# Patient Record
Sex: Female | Born: 1965
Health system: Southern US, Community
[De-identification: ages and names within clinical notes are randomized; demographics above are authoritative.]

## PROBLEM LIST (undated history)

## (undated) DIAGNOSIS — Z789 Other specified health status: Secondary | ICD-10-CM

## (undated) HISTORY — PX: APPENDECTOMY: SHX54

## (undated) HISTORY — PX: GALLBLADDER SURGERY: SHX652

## (undated) HISTORY — PX: ABDOMINAL HYSTERECTOMY: SHX81

---

## 2000-04-07 ENCOUNTER — Inpatient Hospital Stay (HOSPITAL_COMMUNITY): Admission: AD | Admit: 2000-04-07 | Discharge: 2000-04-07 | Payer: Self-pay | Admitting: Obstetrics and Gynecology

## 2000-05-10 ENCOUNTER — Inpatient Hospital Stay (HOSPITAL_COMMUNITY): Admission: AD | Admit: 2000-05-10 | Discharge: 2000-05-13 | Payer: Self-pay | Admitting: Obstetrics and Gynecology

## 2000-05-14 ENCOUNTER — Encounter: Admission: RE | Admit: 2000-05-14 | Discharge: 2000-06-15 | Payer: Self-pay | Admitting: Obstetrics and Gynecology

## 2000-06-14 ENCOUNTER — Other Ambulatory Visit: Admission: RE | Admit: 2000-06-14 | Discharge: 2000-06-14 | Payer: Self-pay | Admitting: Obstetrics and Gynecology

## 2001-07-24 ENCOUNTER — Other Ambulatory Visit: Admission: RE | Admit: 2001-07-24 | Discharge: 2001-07-24 | Payer: Self-pay | Admitting: Obstetrics and Gynecology

## 2002-11-01 ENCOUNTER — Other Ambulatory Visit: Admission: RE | Admit: 2002-11-01 | Discharge: 2002-11-01 | Payer: Self-pay | Admitting: Obstetrics and Gynecology

## 2004-05-15 ENCOUNTER — Other Ambulatory Visit: Admission: RE | Admit: 2004-05-15 | Discharge: 2004-05-15 | Payer: Self-pay | Admitting: Obstetrics and Gynecology

## 2006-03-04 ENCOUNTER — Ambulatory Visit (HOSPITAL_COMMUNITY): Admission: RE | Admit: 2006-03-04 | Discharge: 2006-03-05 | Payer: Self-pay | Admitting: Obstetrics and Gynecology

## 2011-03-29 ENCOUNTER — Emergency Department (HOSPITAL_COMMUNITY): Payer: PRIVATE HEALTH INSURANCE

## 2011-03-29 ENCOUNTER — Encounter: Payer: Self-pay | Admitting: Neurology

## 2011-03-29 ENCOUNTER — Other Ambulatory Visit: Payer: Self-pay

## 2011-03-29 ENCOUNTER — Emergency Department (HOSPITAL_COMMUNITY)
Admission: EM | Admit: 2011-03-29 | Discharge: 2011-03-29 | Disposition: A | Discharge status: Home / Self Care | Payer: PRIVATE HEALTH INSURANCE | Attending: Emergency Medicine | Admitting: Emergency Medicine

## 2011-03-29 DIAGNOSIS — R071 Chest pain on breathing: Secondary | ICD-10-CM | POA: Insufficient documentation

## 2011-03-29 LAB — POCT I-STAT, CHEM 8
Calcium, Ion: 1.19 mmol/L (ref 1.12–1.32)
Chloride: 107 mEq/L (ref 96–112)
HCT: 39 % (ref 36.0–46.0)
Sodium: 140 mEq/L (ref 135–145)

## 2011-03-29 LAB — POCT I-STAT TROPONIN I: Troponin i, poc: 0.01 ng/mL (ref 0.00–0.08)

## 2011-03-29 LAB — D-DIMER, QUANTITATIVE: D-Dimer, Quant: 0.27 ug/mL-FEU (ref 0.00–0.48)

## 2011-03-29 MED ORDER — MORPHINE SULFATE 4 MG/ML IJ SOLN
4.0000 mg | Freq: Once | INTRAMUSCULAR | Status: AC
  Administered 2011-03-29: 4 mg via INTRAVENOUS
  Filled 2011-03-29: qty 1

## 2011-03-29 MED ORDER — HYDROCODONE-ACETAMINOPHEN 5-325 MG PO TABS
1.0000 | ORAL_TABLET | Freq: Four times a day (QID) | ORAL | Status: AC | PRN
Start: 2011-03-29 — End: 2011-04-08

## 2011-03-29 MED ORDER — IBUPROFEN 800 MG PO TABS: 800.0000 mg | ORAL_TABLET | Freq: Three times a day (TID) | ORAL | Status: AC

## 2011-03-29 MED ORDER — KETOROLAC TROMETHAMINE 30 MG/ML IJ SOLN
30.0000 mg | Freq: Once | INTRAMUSCULAR | Status: AC
  Administered 2011-03-29: 30 mg via INTRAVENOUS
  Filled 2011-03-29: qty 1

## 2011-03-29 NOTE — ED Notes (Addendum)
PER EMS- Pt comes from PCP. Cp started last night at 0100, took tylenol PM and 1 baby aspirin. No relief. Went to PCP this morning, c/o chest discomfort "elephant sitting on chest". Pt given 1 SL nitro, GI cocktail, no change in pain. No radiation. No SOB. Reporting nausea no vomiting. 20 in L. AC. CBG 134. 146/86, 77, 18 100% 2 L. Pt report yesterday had [place] for lunch.

## 2011-03-29 NOTE — ED Provider Notes (Signed)
History     CSN: [id number]  Arrival date & time 03/29/11  9563   First MD Initiated Contact with Patient 03/29/11 [phone]      Chief Complaint  Patient presents with  . Chest Pain    (Consider location/radiation/quality/duration/timing/severity/associated sxs/prior treatment) HPI Complains of anterior chest pain substernal in location nonradiating onset 1 AM today awaken her from sleep. Pain has a pleuritic component is nonradiating made worse by lying supine not improved by anything no associated shortness of breath, nausea or sweatiness. No other associated symptoms. At her to severe at present describe as 8 on a scale of 1-10. Treated with one baby aspirin, GI cocktail, and one sublingual nitroglycerin without relief History reviewed. No pertinent past medical history.  Past Surgical History  Procedure Date  . Appendectomy   . Abdominal hysterectomy    Varicose veins stripping No family history on file.  History  Substance Use Topics  . Smoking status: Never Smoker   . Smokeless tobacco: Not on file  . Alcohol Use: No    OB History    Grav Para Term Preterm Abortions TAB SAB Ect Mult Living                  Review of Systems  Constitutional: Negative.   HENT: Negative.   Respiratory: Negative.   Cardiovascular: Positive for chest pain.  Gastrointestinal: Negative.   Musculoskeletal: Negative.   Skin: Negative.   Neurological: Negative.   Hematological: Negative.   Psychiatric/Behavioral: Negative.     Allergies  Review of patient's allergies indicates no known allergies.  Home Medications  No current outpatient prescriptions on file.  BP 151/83  Pulse 70  Temp(Src) 98 F (36.7 C) (Oral)  Resp 18  Ht 5\' 11"  (1.803 m)  Wt 172 lb (78.019 kg)  BMI 23.99 kg/m2  SpO2 100%  Physical Exam  Nursing note and vitals reviewed. Constitutional: She appears well-developed and well-nourished.  HENT:  Head: Normocephalic and atraumatic.  Eyes: Conjunctivae are  normal. Pupils are equal, round, and reactive to light.  Neck: Neck supple. No tracheal deviation present. No thyromegaly present.  Cardiovascular: Normal rate, regular rhythm and intact distal pulses.   No murmur heard. Pulmonary/Chest: Effort normal and breath sounds normal.       Tender over sternum reproducing pain exactly  Abdominal: Soft. Bowel sounds are normal. She exhibits no distension. There is no tenderness.  Musculoskeletal: Normal range of motion. She exhibits no edema and no tenderness.  Neurological: She is alert. Coordination normal.  Skin: Skin is warm and dry. No rash noted.  Psychiatric: She has a normal mood and affect.    ED Course  Procedures (including critical care time)  Labs Reviewed - No data to display No results found.   No diagnosis found.  Date: 03/29/2011  Rate: 70  Rhythm: normal sinus rhythm  QRS Axis: normal  Intervals: normal  ST/T Wave abnormalities: normal  Conduction Disutrbances:none  Narrative Interpretation:   Old EKG Reviewed: none available    MDM  Pretest probability for ACS low area patient has no cardiac risk factors normal EKG atypical history. Move to CDU. Outpatient cardiac workup if today's emergency department workup is unremarkable Diagnoses atypical chest pain        Doug Sou, MD 03/29/11 1127

## 2011-03-29 NOTE — ED Provider Notes (Signed)
Medical screening examination/treatment/procedure(s) were conducted as a shared visit with non-physician practitioner(s) and myself.  I personally evaluated the patient during the encounter  Doug Sou, MD 03/29/11 1750

## 2011-03-29 NOTE — ED Provider Notes (Signed)
12:30 PM patient is in CDU holding for cardiac marker and call to cardiology to establish outpatient followup, d/c home if all normal. This is a shared visit with Dr Ethelda Chick. Patient reports continued anterior chest pressure that has been unrelieved with morphine. On exam, pt is A&Ox4, NAD, RRR, no m/r/g, CTAB.  I have ordered Toradol for her pain.  Will continue to follow.  Patient has last cardiac marker scheduled at 1:30pm.   12:56 PM Per patient's paperwork from [facility], [facility] cardiology was to meet patient in ED.  I have spoken with Rosann Auerbach of [place] who states Dr Antoine Poche wanted to follow the normal procedure of having the ED physician evaluate patient.  I have discussed this with Dr Ethelda Chick who states he would like to continue to original plan, cardiac marker at 1:30pm, outpatient cardiology follow up.  Patient reports pain is much improved with toradol.    2:20 PM Patient requests follow up with [facility] as that is where her husband is seen.  I have discussed patient with Wilburt Finlay of [facility] who will arrange follow up for patient.    Dillard Cannon Doffing, [place] 03/29/11 1609

## 2011-06-08 ENCOUNTER — Other Ambulatory Visit: Payer: Self-pay | Admitting: Family Medicine

## 2011-06-08 ENCOUNTER — Encounter (HOSPITAL_COMMUNITY): Payer: Self-pay | Admitting: *Deleted

## 2011-06-08 ENCOUNTER — Observation Stay (HOSPITAL_COMMUNITY)
Admission: EM | Admit: 2011-06-08 | Discharge: 2011-06-12 | DRG: 494 | Disposition: A | Discharge status: Home / Self Care | Payer: BC Managed Care – PPO | Attending: General Surgery | Admitting: General Surgery

## 2011-06-08 ENCOUNTER — Ambulatory Visit (HOSPITAL_COMMUNITY)
Admission: RE | Admit: 2011-06-08 | Discharge: 2011-06-08 | Disposition: A | Discharge status: Home / Self Care | Payer: BC Managed Care – PPO | Source: Ambulatory Visit | Attending: Family Medicine | Admitting: Family Medicine

## 2011-06-08 ENCOUNTER — Emergency Department (HOSPITAL_COMMUNITY): Payer: BC Managed Care – PPO

## 2011-06-08 ENCOUNTER — Other Ambulatory Visit: Payer: Self-pay

## 2011-06-08 DIAGNOSIS — R112 Nausea with vomiting, unspecified: Secondary | ICD-10-CM | POA: Diagnosis present

## 2011-06-08 DIAGNOSIS — K819 Cholecystitis, unspecified: Secondary | ICD-10-CM

## 2011-06-08 DIAGNOSIS — K8062 Calculus of gallbladder and bile duct with acute cholecystitis without obstruction: Principal | ICD-10-CM | POA: Diagnosis present

## 2011-06-08 DIAGNOSIS — Z01812 Encounter for preprocedural laboratory examination: Secondary | ICD-10-CM

## 2011-06-08 HISTORY — DX: Other specified health status: Z78.9

## 2011-06-08 LAB — COMPREHENSIVE METABOLIC PANEL
AST: 689 U/L — ABNORMAL HIGH (ref 0–37)
Albumin: 4.4 g/dL (ref 3.5–5.2)
Alkaline Phosphatase: 308 U/L — ABNORMAL HIGH (ref 39–117)
Chloride: 97 mEq/L (ref 96–112)
Potassium: 3.4 mEq/L — ABNORMAL LOW (ref 3.5–5.1)
Sodium: 136 mEq/L (ref 135–145)
Total Bilirubin: 5.6 mg/dL — ABNORMAL HIGH (ref 0.3–1.2)

## 2011-06-08 LAB — URINALYSIS, ROUTINE W REFLEX MICROSCOPIC
Glucose, UA: NEGATIVE mg/dL
Ketones, ur: >80 mg/dL — AB
Leukocytes, UA: NEGATIVE
pH: 8 (ref 5.0–8.0)

## 2011-06-08 LAB — CBC
MCHC: 34.2 g/dL (ref 30.0–36.0)
RDW: 12.4 % (ref 11.5–15.5)

## 2011-06-08 LAB — DIFFERENTIAL
Basophils Absolute: 0 10*3/uL (ref 0.0–0.1)
Basophils Relative: 0 % (ref 0–1)
Neutro Abs: 12.4 10*3/uL — ABNORMAL HIGH (ref 1.7–7.7)
Neutrophils Relative %: 89 % — ABNORMAL HIGH (ref 43–77)

## 2011-06-08 LAB — URINE MICROSCOPIC-ADD ON

## 2011-06-08 MED ORDER — ONDANSETRON HCL 4 MG/2ML IJ SOLN
4.0000 mg | Freq: Once | INTRAMUSCULAR | Status: AC
  Administered 2011-06-08: 4 mg via INTRAVENOUS

## 2011-06-08 MED ORDER — PROMETHAZINE HCL 25 MG/ML IJ SOLN
INTRAMUSCULAR | Status: AC
  Administered 2011-06-08: 21:00:00
  Filled 2011-06-08: qty 1

## 2011-06-08 MED ORDER — ONDANSETRON HCL 4 MG/2ML IJ SOLN
4.0000 mg | Freq: Once | INTRAMUSCULAR | Status: AC
  Administered 2011-06-08: 4 mg via INTRAVENOUS
  Filled 2011-06-08: qty 2

## 2011-06-08 MED ORDER — LACTATED RINGERS IV SOLN
INTRAVENOUS | Status: DC
  Administered 2011-06-09 (×2): via INTRAVENOUS

## 2011-06-08 MED ORDER — ONDANSETRON HCL 4 MG/2ML IJ SOLN
4.0000 mg | Freq: Once | INTRAMUSCULAR | Status: AC
  Administered 2011-06-11: 4 mg via INTRAVENOUS

## 2011-06-08 MED ORDER — HYDROMORPHONE HCL PF 1 MG/ML IJ SOLN
1.0000 mg | Freq: Once | INTRAMUSCULAR | Status: AC
Start: 2011-06-08 — End: 2011-06-08
  Administered 2011-06-08: 1 mg via INTRAVENOUS
  Filled 2011-06-08: qty 1

## 2011-06-08 MED ORDER — PANTOPRAZOLE SODIUM 40 MG IV SOLR
40.0000 mg | Freq: Every day | INTRAVENOUS | Status: DC
  Administered 2011-06-08 – 2011-06-11 (×4): 40 mg via INTRAVENOUS
  Filled 2011-06-08 (×4): qty 40

## 2011-06-08 MED ORDER — ENOXAPARIN SODIUM 40 MG/0.4ML ~~LOC~~ SOLN
40.0000 mg | SUBCUTANEOUS | Status: DC
  Filled 2011-06-08: qty 0.4

## 2011-06-08 MED ORDER — HYDROMORPHONE HCL PF 1 MG/ML IJ SOLN
1.0000 mg | INTRAMUSCULAR | Status: DC | PRN
  Administered 2011-06-08: 2 mg via INTRAVENOUS
  Administered 2011-06-09: 1 mg via INTRAVENOUS
  Administered 2011-06-09 (×2): 2 mg via INTRAVENOUS
  Administered 2011-06-10: 1 mg via INTRAVENOUS
  Filled 2011-06-08 (×2): qty 2
  Filled 2011-06-08: qty 1
  Filled 2011-06-08 (×3): qty 2

## 2011-06-08 MED ORDER — ONDANSETRON HCL 4 MG/2ML IJ SOLN
4.0000 mg | Freq: Four times a day (QID) | INTRAMUSCULAR | Status: DC | PRN
Start: 2011-06-08 — End: 2011-06-12
  Filled 2011-06-08: qty 2

## 2011-06-08 MED ORDER — HYDROMORPHONE HCL PF 1 MG/ML IJ SOLN
INTRAMUSCULAR | Status: AC
  Filled 2011-06-08: qty 1

## 2011-06-08 MED ORDER — SODIUM CHLORIDE 0.9 % IJ SOLN
INTRAMUSCULAR | Status: AC
  Administered 2011-06-08
  Filled 2011-06-08: qty 3

## 2011-06-08 MED ORDER — HYDROMORPHONE HCL PF 1 MG/ML IJ SOLN
1.0000 mg | Freq: Once | INTRAMUSCULAR | Status: AC
  Administered 2011-06-08: 1 mg via INTRAVENOUS

## 2011-06-08 MED ORDER — SODIUM CHLORIDE 0.9 % IV BOLUS (SEPSIS)
1000.0000 mL | Freq: Once | INTRAVENOUS | Status: AC
  Administered 2011-06-08: 1000 mL via INTRAVENOUS

## 2011-06-08 MED ORDER — ONDANSETRON HCL 4 MG/2ML IJ SOLN
INTRAMUSCULAR | Status: AC
  Filled 2011-06-08: qty 2

## 2011-06-08 MED ORDER — GADOBENATE DIMEGLUMINE 529 MG/ML IV SOLN
15.0000 mL | Freq: Once | INTRAVENOUS | Status: AC | PRN
  Administered 2011-06-08: 15 mL via INTRAVENOUS

## 2011-06-08 MED ORDER — SODIUM CHLORIDE 0.9 % IV SOLN
1.0000 g | Freq: Once | INTRAVENOUS | Status: AC
  Administered 2011-06-08: 19:00:00 via INTRAVENOUS
  Filled 2011-06-08: qty 1

## 2011-06-08 MED ORDER — SODIUM CHLORIDE 0.9 % IV SOLN
1.0000 g | INTRAVENOUS | Status: DC
  Administered 2011-06-10: 1 g via INTRAVENOUS
  Filled 2011-06-08 (×5): qty 1

## 2011-06-08 NOTE — ED Notes (Signed)
Patient remains in mri  

## 2011-06-08 NOTE — H&P (Signed)
Traci Molina is an 46 y.o. female.   Chief Complaint: Chest pain/epigastric abdominal pain HPI: Patient presents to [facility] with over a month history of epigastric and retrosternal chest pain. She states she is actually presented to the emergency department on several previous occasions and was asked to transferred previously to come hospital for cardiac evaluation. All cardiac workup has been negative to date and patient continues to have epigastric and sternal chest pain. She states pain has been progressively getting worse with radiation to the right upper quadrant and right back. Pain is worse with eating. She has not noticed any particular foods causing increased symptomatology. Her husband has stated he noted her appearing more jaundiced today. She has noticed increase in dark urine over the last 24 hours. She has not noticed any acholic stools however she denies any bowel movement over the last 24-48 hours. Her last bowel movement was reported as normal with no melena or hematochezia. She has had some low-grade fevers and subjective chills. Her appetite is poor. She does have a history of biliary disease. She has had a previous pregnancy. No unusual travel or exposures. No sick contacts. No similar symptomatology prior to this last month.  History reviewed. No pertinent past medical history.  Past Surgical History  Procedure Date  . Appendectomy   . Abdominal hysterectomy     History reviewed. No pertinent family history. Social History:  reports that she has never smoked. She does not have any smokeless tobacco history on file. She reports that she does not drink alcohol or use illicit drugs.  Allergies: No Known Allergies  Medications Prior to Admission  Medication Dose Route Frequency Provider Last Rate Last Dose  . enoxaparin (LOVENOX) injection 40 mg  40 mg Subcutaneous Q24H Fabio Bering, MD      . ertapenem [facility]) 1 g in sodium chloride 0.9 % 50 mL IVPB  1 g  Intravenous Once Glynn Octave, MD      . ertapenem [facility]) 1 g in sodium chloride 0.9 % 50 mL IVPB  1 g Intravenous Q24H Fabio Bering, MD      . HYDROmorphone (DILAUDID) injection 1 mg  1 mg Intravenous Once Glynn Octave, MD   1 mg at 06/08/11 1757  . HYDROmorphone (DILAUDID) injection 1 mg  1 mg Intravenous Once Glynn Octave, MD   1 mg at 06/08/11 2027  . HYDROmorphone (DILAUDID) injection 1-2 mg  1-2 mg Intravenous Q4H PRN Fabio Bering, MD      . lactated ringers infusion   Intravenous Continuous Fabio Bering, MD      . ondansetron [facility]) injection 4 mg  4 mg Intravenous Once Glynn Octave, MD   4 mg at 06/08/11 1756  . ondansetron (ZOFRAN) injection 4 mg  4 mg Intravenous Once Glynn Octave, MD   4 mg at 06/08/11 2010  . ondansetron (ZOFRAN) injection 4 mg  4 mg Intravenous Once Glynn Octave, MD      . ondansetron [facility]) injection 4 mg  4 mg Intravenous Q6H PRN Fabio Bering, MD      . pantoprazole (PROTONIX) injection 40 mg  40 mg Intravenous QHS Fabio Bering, MD      . sodium chloride 0.9 % bolus 1,000 mL  1,000 mL Intravenous Once Glynn Octave, MD   1,000 mL at 06/08/11 1756   No current outpatient prescriptions on file as of 06/08/2011.    Results for orders placed during the hospital encounter of 06/08/11 (  from the past 48 hour(s))  CBC     Status: Abnormal   Collection Time   06/08/11  5:50 PM      Component Value Range Comment   WBC 14.0 (*) 4.0 - 10.5 (K/uL)    RBC 4.93  3.87 - 5.11 (MIL/uL)    Hemoglobin 13.9  12.0 - 15.0 (g/dL)    HCT 16.1  09.6 - 04.5 (%)    MCV 82.6  78.0 - 100.0 (fL)    MCH 28.2  26.0 - 34.0 (pg)    MCHC 34.2  30.0 - 36.0 (g/dL)    RDW 40.9  81.1 - 91.4 (%)    Platelets 230  150 - 400 (K/uL)   DIFFERENTIAL     Status: Abnormal   Collection Time   06/08/11  5:50 PM      Component Value Range Comment   Neutrophils Relative 89 (*) 43 - 77 (%)    Neutro Abs 12.4 (*) 1.7 - 7.7 (K/uL)    Lymphocytes Relative 4 (*) 12 -  46 (%)    Lymphs Abs 0.5 (*) 0.7 - 4.0 (K/uL)    Monocytes Relative 7  3 - 12 (%)    Monocytes Absolute 1.0  0.1 - 1.0 (K/uL)    Eosinophils Relative 0  0 - 5 (%)    Eosinophils Absolute 0.0  0.0 - 0.7 (K/uL)    Basophils Relative 0  0 - 1 (%)    Basophils Absolute 0.0  0.0 - 0.1 (K/uL)   COMPREHENSIVE METABOLIC PANEL     Status: Abnormal   Collection Time   06/08/11  5:50 PM      Component Value Range Comment   Sodium 136  135 - 145 (mEq/L)    Potassium 3.4 (*) 3.5 - 5.1 (mEq/L)    Chloride 97  96 - 112 (mEq/L)    CO2 25  19 - 32 (mEq/L)    Glucose, Bld 122 (*) 70 - 99 (mg/dL)    BUN 9  6 - 23 (mg/dL)    Creatinine, Ser 7.82  0.50 - 1.10 (mg/dL)    Calcium 95.6  8.4 - 10.5 (mg/dL)    Total Protein 8.1  6.0 - 8.3 (g/dL)    Albumin 4.4  3.5 - 5.2 (g/dL)    AST 213 (*) 0 - 37 (U/L)    ALT 1278 (*) 0 - 35 (U/L)    Alkaline Phosphatase 308 (*) 39 - 117 (U/L)    Total Bilirubin 5.6 (*) 0.3 - 1.2 (mg/dL)    GFR calc non Af Amer >90  >90 (mL/min)    GFR calc Af Amer >90  >90 (mL/min)   LIPASE, BLOOD     Status: Normal   Collection Time   06/08/11  5:50 PM      Component Value Range Comment   Lipase 24  11 - 59 (U/L)   TROPONIN I     Status: Normal   Collection Time   06/08/11  5:50 PM      Component Value Range Comment   Troponin I <0.30  <0.30 (ng/mL)   BILIRUBIN, DIRECT     Status: Abnormal   Collection Time   06/08/11  5:50 PM      Component Value Range Comment   Bilirubin, Direct 3.4 (*) 0.0 - 0.3 (mg/dL)   URINALYSIS, ROUTINE W REFLEX MICROSCOPIC     Status: Abnormal   Collection Time   06/08/11  7:18 PM      Component Value  Range Comment   Color, Urine ORANGE (*) YELLOW  BIOCHEMICALS MAY BE AFFECTED BY COLOR   APPearance CLEAR  CLEAR     Specific Gravity, Urine 1.020  1.005 - 1.030     pH 8.0  5.0 - 8.0     Glucose, UA NEGATIVE  NEGATIVE (mg/dL)    Hgb urine dipstick TRACE (*) NEGATIVE     Bilirubin Urine LARGE (*) NEGATIVE     Ketones, ur >80 (*) NEGATIVE (mg/dL)     Protein, ur TRACE (*) NEGATIVE (mg/dL)    Urobilinogen, UA 1.0  0.0 - 1.0 (mg/dL)    Nitrite NEGATIVE  NEGATIVE     Leukocytes, UA NEGATIVE  NEGATIVE    URINE MICROSCOPIC-ADD ON     Status: Abnormal   Collection Time   06/08/11  7:18 PM      Component Value Range Comment   Squamous Epithelial / LPF FEW (*) RARE     WBC, UA 3-6  <3 (WBC/hpf)    RBC / HPF 3-6  <3 (RBC/hpf)    Bacteria, UA FEW (*) RARE     Dg Chest 2 View  06/08/2011  *RADIOLOGY REPORT*  Clinical Data: Abdominal pain  CHEST - 2 VIEW  Comparison: 03/29/2011  Findings:  The heart size is normal.  There is no pleural effusion or edema.  No airspace consolidation identified.  Review of the visualized osseous structures is unremarkable.  IMPRESSION:  1.  No acute findings.  Original Report Authenticated By: Rosealee Albee, M.D.   US Abdomen Limited Ruq  06/08/2011  *RADIOLOGY REPORT*  Clinical Data:  Nausea, vomiting, abdominal pain  LIMITED ABDOMINAL ULTRASOUND - RIGHT UPPER QUADRANT  Comparison:  None  Findings:  Gallbladder:  Shadowing gallstones and sludge within gallbladder. Minimally thickened gallbladder wall.  No definite pericholecystic fluid or sonographic Murphy's sign.  Common bile duct:  Dilated, 9 mm diameter.  Liver:  Normal echogenicity without definite mass.  Intrahepatic biliary dilatation present.  Hepatopetal portal venous flow.  No right upper quadrant ascites.  IMPRESSION: Distended gallbladder containing sludge and shadowing gallstones, associated with mild gallbladder wall thickening, question early acute cholecystitis. Intrahepatic and extrahepatic biliary dilatation, CBD 9 mm diameter. Distal CBD obstruction/choledocolithiasis not excluded.                   Original Report Authenticated By: Lollie Marrow, M.D.    Review of Systems  Constitutional: Positive for fever, chills, weight loss, malaise/fatigue and diaphoresis.  HENT: Negative.   Eyes: Negative.   Respiratory: Negative.   Cardiovascular: Positive  for chest pain.  Gastrointestinal: Positive for heartburn, nausea, vomiting and abdominal pain (epigastric and right upper quadrant). Negative for diarrhea, blood in stool and melena.  Genitourinary:       Urine has been dark  Musculoskeletal: Negative.   Skin: Positive for itching.       Yellow  Neurological: Negative.  Negative for weakness.  Endo/Heme/Allergies: Negative.   Psychiatric/Behavioral: Negative.     Blood pressure 167/89, pulse 86, temperature 99.6 F (37.6 C), temperature source Oral, resp. rate 16, height 5\' 11"  (1.803 m), weight 78.019 kg (172 lb), SpO2 95.00%. Physical Exam  Constitutional: She is oriented to person, place, and time. She appears well-developed and well-nourished. No distress.       Uncomfortable in appearance  HENT:  Head: Normocephalic and atraumatic.  Eyes: Conjunctivae and EOM are normal. Pupils are equal, round, and reactive to light. Scleral icterus is present.  Neck: Normal range of motion.  No tracheal deviation present. No thyromegaly present.  Cardiovascular: Normal rate, regular rhythm and normal heart sounds.   Respiratory: Effort normal and breath sounds normal. No respiratory distress. She has no wheezes.  GI: Soft. She exhibits no distension and no mass. There is tenderness (epigastric and right upper quadrant. Positive Murphy sign. No diffuse peritoneal signs.). There is no rebound and no guarding.  Musculoskeletal: Normal range of motion.  Lymphadenopathy:    She has no cervical adenopathy.  Neurological: She is alert and oriented to person, place, and time.  Skin: Skin is warm and dry.       Diffuse jaundice     Assessment/Plan Acute cholecystitis was suspected common bile duct stone and obstruction. Findings were discussed with the patient and her husband. At this point she will be kept in an n.p.o. status other than a couple eyes chips. Will be continued on IV fluid hydration. Blood work will be reevaluated tomorrow morning to  reevaluate bilirubin levels as well as obtaining an MRCP for assistance of gastroenterology. I highly suspect patient will require an ERCP but again will check the results of the MRCP in the morning. Patient will be continued on IV fluid hydration, IV antibiotics with Invanz, bowel rest, and pain and nausea control. Surgical indications were discussed with both the patient and her husband. Again at this point evaluation for common bile duct stone we'll need to proceed any planned cholecystectomy. Traci Molina C 06/08/2011, 8:31 PM

## 2011-06-08 NOTE — ED Notes (Signed)
Pt c/o epigastric pain off and on since January, worse over the past two days, also associated with n/v, worse after eating, pt sent to er from ultrasound. Dr. Manus Gunning in room with pt upon arrival to ed.

## 2011-06-08 NOTE — ED Notes (Signed)
abd pain, NV, sent from U/S, says she is having gall bladder problem.

## 2011-06-08 NOTE — ED Provider Notes (Signed)
History     CSN: [id number]  Arrival date & time 06/08/11  1659   First MD Initiated Contact with Patient 06/08/11 1725      Chief Complaint  Patient presents with  . Abdominal Pain    (Consider location/radiation/quality/duration/timing/severity/associated sxs/prior treatment) HPI Comments: Patient sent from ultrasound with findings worrisome for cholecystitis. She's been having right upper quadrant pain with nausea and vomiting intermittently for 2 months and has gotten worse in the past 3 days. She states her pain has been constant since Friday and she had some nausea and vomiting 2 days ago. She's had poor appetite. She denies any fever, chills, chest pain, shortness of breath. January she was seen and evaluated for the same pain and had a negative cardiac workup.  The history is provided by the patient.    History reviewed. No pertinent past medical history.  Past Surgical History  Procedure Date  . Appendectomy   . Abdominal hysterectomy     History reviewed. No pertinent family history.  History  Substance Use Topics  . Smoking status: Never Smoker   . Smokeless tobacco: Not on file  . Alcohol Use: No    OB History    Grav Para Term Preterm Abortions TAB SAB Ect Mult Living                  Review of Systems  Constitutional: Positive for activity change and appetite change. Negative for fever.  HENT: Negative for congestion and rhinorrhea.   Respiratory: Negative for cough, chest tightness and shortness of breath.   Cardiovascular: Negative for chest pain.  Gastrointestinal: Positive for nausea, vomiting and abdominal pain. Negative for diarrhea.  Genitourinary: Negative for dysuria, hematuria, vaginal bleeding and vaginal discharge.  Musculoskeletal: Negative for back pain.  Skin: Negative for rash.  Neurological: Negative for headaches.    Allergies  Review of patient's allergies indicates no known allergies.  Home Medications   No current  outpatient prescriptions on file.  BP 164/84  Pulse 80  Temp(Src) 98.3 F (36.8 C) (Oral)  Resp 18  Ht 5\' 11"  (1.803 m)  Wt 188 lb 8 oz (85.503 kg)  BMI 26.29 kg/m2  SpO2 97%  Physical Exam  Constitutional: She is oriented to person, place, and time. She appears well-developed and well-nourished. No distress.  HENT:  Head: Normocephalic and atraumatic.  Mouth/Throat: Oropharynx is clear and moist. No oropharyngeal exudate.  Eyes: Conjunctivae are normal. Pupils are equal, round, and reactive to light. Scleral icterus is present.  Neck: Normal range of motion. Neck supple.  Cardiovascular: Normal rate, regular rhythm and normal heart sounds.   Pulmonary/Chest: Effort normal and breath sounds normal. No respiratory distress.  Abdominal: Soft. There is tenderness. There is guarding.       Right upper quadrant pain with guarding, positive Murphy sign  Musculoskeletal: Normal range of motion. She exhibits no edema and no tenderness.       No CVA tenderness  Neurological: She is alert and oriented to person, place, and time. No cranial nerve deficit.  Skin: Skin is warm.       jaundiced    ED Course  Procedures (including critical care time)  Labs Reviewed  CBC - Abnormal; Notable for the following:    WBC 14.0 (*)    All other components within normal limits  DIFFERENTIAL - Abnormal; Notable for the following:    Neutrophils Relative 89 (*)    Neutro Abs 12.4 (*)    Lymphocytes Relative 4 (*)  Lymphs Abs 0.5 (*)    All other components within normal limits  COMPREHENSIVE METABOLIC PANEL - Abnormal; Notable for the following:    Potassium 3.4 (*)    Glucose, Bld 122 (*)    AST 689 (*)    ALT 1278 (*)    Alkaline Phosphatase 308 (*)    Total Bilirubin 5.6 (*)    All other components within normal limits  URINALYSIS, ROUTINE W REFLEX MICROSCOPIC - Abnormal; Notable for the following:    Color, Urine ORANGE (*) BIOCHEMICALS MAY BE AFFECTED BY COLOR   Hgb urine dipstick  TRACE (*)    Bilirubin Urine LARGE (*)    Ketones, ur >80 (*)    Protein, ur TRACE (*)    All other components within normal limits  BILIRUBIN, DIRECT - Abnormal; Notable for the following:    Bilirubin, Direct 3.4 (*)    All other components within normal limits  URINE MICROSCOPIC-ADD ON - Abnormal; Notable for the following:    Squamous Epithelial / LPF FEW (*)    Bacteria, UA FEW (*)    All other components within normal limits  LIPASE, BLOOD  TROPONIN I  BASIC METABOLIC PANEL  CBC  HEPATIC FUNCTION PANEL   Dg Chest 2 View  06/08/2011  *RADIOLOGY REPORT*  Clinical Data: Abdominal pain  CHEST - 2 VIEW  Comparison: 03/29/2011  Findings:  The heart size is normal.  There is no pleural effusion or edema.  No airspace consolidation identified.  Review of the visualized osseous structures is unremarkable.  IMPRESSION:  1.  No acute findings.  Original Report Authenticated By: Rosealee Albee, M.D.   US Abdomen Limited Ruq  06/08/2011  *RADIOLOGY REPORT*  Clinical Data:  Nausea, vomiting, abdominal pain  LIMITED ABDOMINAL ULTRASOUND - RIGHT UPPER QUADRANT  Comparison:  None  Findings:  Gallbladder:  Shadowing gallstones and sludge within gallbladder. Minimally thickened gallbladder wall.  No definite pericholecystic fluid or sonographic Murphy's sign.  Common bile duct:  Dilated, 9 mm diameter.  Liver:  Normal echogenicity without definite mass.  Intrahepatic biliary dilatation present.  Hepatopetal portal venous flow.  No right upper quadrant ascites.  IMPRESSION: Distended gallbladder containing sludge and shadowing gallstones, associated with mild gallbladder wall thickening, question early acute cholecystitis. Intrahepatic and extrahepatic biliary dilatation, CBD 9 mm diameter. Distal CBD obstruction/choledocolithiasis not excluded.                   Original Report Authenticated By: Lollie Marrow, M.D.     1. Cholecystitis       MDM  Upper abdominal pain with nausea and vomiting  ultrasound concerning for cholecystitis. Vitals stable, abdomen soft with right upper quadrant pain with guarding.  Labs, IV hydration, symptom control, discuss with surgery.   D/w Dr. Leticia Penna who will admit patient.  Will likely need ERCP.      Date: 06/08/2011  Rate: 80  Rhythm: normal sinus rhythm  QRS Axis: normal  Intervals: normal  ST/T Wave abnormalities: normal  Conduction Disutrbances:none  Narrative Interpretation:   Old EKG Reviewed: unchanged    Glynn Octave, MD 06/08/11 2218

## 2011-06-09 ENCOUNTER — Encounter (HOSPITAL_COMMUNITY)
Admission: EM | Disposition: A | Discharge status: Home / Self Care | Payer: Self-pay | Source: Home / Self Care | Attending: Emergency Medicine

## 2011-06-09 ENCOUNTER — Inpatient Hospital Stay (HOSPITAL_COMMUNITY): Payer: BC Managed Care – PPO | Admitting: Anesthesiology

## 2011-06-09 ENCOUNTER — Inpatient Hospital Stay (HOSPITAL_COMMUNITY): Payer: BC Managed Care – PPO

## 2011-06-09 ENCOUNTER — Encounter (HOSPITAL_COMMUNITY): Payer: Self-pay

## 2011-06-09 ENCOUNTER — Encounter (HOSPITAL_COMMUNITY): Payer: Self-pay | Admitting: Anesthesiology

## 2011-06-09 DIAGNOSIS — K8050 Calculus of bile duct without cholangitis or cholecystitis without obstruction: Secondary | ICD-10-CM

## 2011-06-09 DIAGNOSIS — R17 Unspecified jaundice: Secondary | ICD-10-CM

## 2011-06-09 HISTORY — PX: ERCP: SHX5425

## 2011-06-09 HISTORY — PX: SPHINCTEROTOMY: SHX5544

## 2011-06-09 HISTORY — PX: BILIARY STENT PLACEMENT: SHX5538

## 2011-06-09 LAB — CBC
Hemoglobin: 13.1 g/dL (ref 12.0–15.0)
MCH: 28.1 pg (ref 26.0–34.0)
MCHC: 33.2 g/dL (ref 30.0–36.0)
Platelets: 212 10*3/uL (ref 150–400)
RBC: 4.66 MIL/uL (ref 3.87–5.11)

## 2011-06-09 LAB — BASIC METABOLIC PANEL
BUN: 11 mg/dL (ref 6–23)
Calcium: 9.7 mg/dL (ref 8.4–10.5)
GFR calc Af Amer: >90 mL/min (ref >90)
GFR calc non Af Amer: >90 mL/min (ref >90)
Glucose, Bld: 107 mg/dL — ABNORMAL HIGH (ref 70–99)
Potassium: 3.6 mEq/L (ref 3.5–5.1)
Sodium: 136 mEq/L (ref 135–145)

## 2011-06-09 LAB — HEPATIC FUNCTION PANEL
Albumin: 3.8 g/dL (ref 3.5–5.2)
Alkaline Phosphatase: 291 U/L — ABNORMAL HIGH (ref 39–117)
Indirect Bilirubin: 2.4 mg/dL — ABNORMAL HIGH (ref 0.3–0.9)
Total Protein: 7.3 g/dL (ref 6.0–8.3)

## 2011-06-09 SURGERY — ERCP, WITH INTERVENTION IF INDICATED
Anesthesia: General

## 2011-06-09 MED ORDER — LIDOCAINE HCL (PF) 1 % IJ SOLN
INTRAMUSCULAR | Status: AC
  Filled 2011-06-09: qty 5

## 2011-06-09 MED ORDER — MIDAZOLAM HCL 2 MG/2ML IJ SOLN
INTRAMUSCULAR | Status: AC
  Administered 2011-06-09: 2 mg via INTRAVENOUS
  Filled 2011-06-09: qty 2

## 2011-06-09 MED ORDER — MIDAZOLAM HCL 2 MG/2ML IJ SOLN
1.0000 mg | INTRAMUSCULAR | Status: DC | PRN
  Administered 2011-06-09: 2 mg via INTRAVENOUS

## 2011-06-09 MED ORDER — GLYCOPYRROLATE 0.2 MG/ML IJ SOLN
INTRAMUSCULAR | Status: AC
  Filled 2011-06-09: qty 1

## 2011-06-09 MED ORDER — DEXAMETHASONE SODIUM PHOSPHATE 4 MG/ML IJ SOLN
4.0000 mg | Freq: Once | INTRAMUSCULAR | Status: AC
  Administered 2011-06-09: 4 mg via INTRAVENOUS

## 2011-06-09 MED ORDER — LIDOCAINE HCL (CARDIAC) 10 MG/ML IV SOLN
INTRAVENOUS | Status: DC | PRN
  Administered 2011-06-09: 10 mg via INTRAVENOUS

## 2011-06-09 MED ORDER — DEXAMETHASONE SODIUM PHOSPHATE 4 MG/ML IJ SOLN
INTRAMUSCULAR | Status: AC
  Filled 2011-06-09: qty 1

## 2011-06-09 MED ORDER — SUCCINYLCHOLINE CHLORIDE 20 MG/ML IJ SOLN
INTRAMUSCULAR | Status: DC | PRN
  Administered 2011-06-09: 120 mg via INTRAVENOUS

## 2011-06-09 MED ORDER — STERILE WATER FOR IRRIGATION IR SOLN
Status: DC | PRN
  Administered 2011-06-09: 1000 mL

## 2011-06-09 MED ORDER — DEXTROSE-NACL 5-0.45 % IV SOLN
INTRAVENOUS | Status: DC
  Administered 2011-06-09 – 2011-06-10 (×2): via INTRAVENOUS

## 2011-06-09 MED ORDER — GLYCOPYRROLATE 0.2 MG/ML IJ SOLN
0.2000 mg | Freq: Once | INTRAMUSCULAR | Status: AC
  Administered 2011-06-09: 0.2 mg via INTRAVENOUS

## 2011-06-09 MED ORDER — PROMETHAZINE HCL 25 MG/ML IJ SOLN
12.5000 mg | INTRAMUSCULAR | Status: DC | PRN
  Administered 2011-06-11 (×2): 25 mg via INTRAVENOUS
  Filled 2011-06-09 (×3): qty 1

## 2011-06-09 MED ORDER — ROCURONIUM BROMIDE 100 MG/10ML IV SOLN
INTRAVENOUS | Status: DC | PRN
  Administered 2011-06-09: 5 mg via INTRAVENOUS
  Administered 2011-06-09: 25 mg via INTRAVENOUS

## 2011-06-09 MED ORDER — ONDANSETRON HCL 4 MG/2ML IJ SOLN: 4.0000 mg | Freq: Once | INTRAMUSCULAR | Status: AC | PRN

## 2011-06-09 MED ORDER — SODIUM CHLORIDE 0.9 % IV SOLN
INTRAVENOUS | Status: DC | PRN
  Administered 2011-06-09: 13:00:00

## 2011-06-09 MED ORDER — ONDANSETRON HCL 4 MG/2ML IJ SOLN
INTRAMUSCULAR | Status: AC
  Administered 2011-06-09: 4 mg via INTRAVENOUS
  Filled 2011-06-09: qty 2

## 2011-06-09 MED ORDER — PROPOFOL 10 MG/ML IV EMUL
INTRAVENOUS | Status: DC | PRN
  Administered 2011-06-09: 150 mg via INTRAVENOUS

## 2011-06-09 MED ORDER — FENTANYL CITRATE 0.05 MG/ML IJ SOLN
INTRAMUSCULAR | Status: DC | PRN
  Administered 2011-06-09 (×2): 50 ug via INTRAVENOUS

## 2011-06-09 MED ORDER — FENTANYL CITRATE 0.05 MG/ML IJ SOLN
INTRAMUSCULAR | Status: AC
  Filled 2011-06-09: qty 2

## 2011-06-09 MED ORDER — FENTANYL CITRATE 0.05 MG/ML IJ SOLN: 25.0000 ug | INTRAMUSCULAR | Status: DC | PRN

## 2011-06-09 MED ORDER — SUCCINYLCHOLINE CHLORIDE 20 MG/ML IJ SOLN
INTRAMUSCULAR | Status: AC
  Filled 2011-06-09: qty 1

## 2011-06-09 MED ORDER — BUTAMBEN-TETRACAINE-BENZOCAINE 2-2-14 % EX AERO
1.0000 | INHALATION_SPRAY | Freq: Once | CUTANEOUS | Status: AC
  Administered 2011-06-09: 1 via TOPICAL

## 2011-06-09 MED ORDER — PROPOFOL 10 MG/ML IV EMUL
INTRAVENOUS | Status: AC
  Filled 2011-06-09: qty 20

## 2011-06-09 MED ORDER — STERILE WATER FOR IRRIGATION IR SOLN
Status: DC | PRN
  Administered 2011-06-09: 13:00:00

## 2011-06-09 MED ORDER — ROCURONIUM BROMIDE 50 MG/5ML IV SOLN
INTRAVENOUS | Status: AC
  Filled 2011-06-09: qty 1

## 2011-06-09 MED ORDER — IOHEXOL 350 MG/ML SOLN: INTRAVENOUS | Status: DC | PRN

## 2011-06-09 MED ORDER — GLUCAGON HCL (RDNA) 1 MG IJ SOLR
INTRAMUSCULAR | Status: DC | PRN
  Administered 2011-06-09: .25 mg via INTRAVENOUS
  Administered 2011-06-09: 0.25 mg via INTRAVENOUS
  Administered 2011-06-09 (×3): .25 mg via INTRAVENOUS

## 2011-06-09 MED ORDER — ONDANSETRON HCL 4 MG/2ML IJ SOLN
4.0000 mg | Freq: Once | INTRAMUSCULAR | Status: AC
  Administered 2011-06-09: 4 mg via INTRAVENOUS

## 2011-06-09 MED ORDER — LACTATED RINGERS IV SOLN
INTRAVENOUS | Status: DC
  Administered 2011-06-09: 12:00:00 via INTRAVENOUS

## 2011-06-09 SURGICAL SUPPLY — 21 items
BAG HAMPER (MISCELLANEOUS) ×1 IMPLANT
BALLN RETRIEVAL 12X15 (BALLOONS) ×1 IMPLANT
BALN RTRVL 200 6-7FR 12-15 (BALLOONS) ×2
BASKET TRAPEZOID LITHO 2.5X5 (MISCELLANEOUS) ×1 IMPLANT
DEVICE LOCKING W-BIOPSY CAP (MISCELLANEOUS) ×1 IMPLANT
FORCEPS BIOP RAD 4 LRG CAP 4 (CUTTING FORCEPS) ×1 IMPLANT
GUIDEWIRE JAG HINI 025X260CM (WIRE) ×1 IMPLANT
KIT ROOM TURNOVER APOR (KITS) ×1 IMPLANT
LUBRICANT JELLY 4.5OZ STERILE (MISCELLANEOUS) ×1 IMPLANT
NDL HYPO 18GX1.5 BLUNT FILL (NEEDLE) IMPLANT
NEEDLE HYPO 18GX1.5 BLUNT FILL (NEEDLE) ×3 IMPLANT
PAD ARMBOARD 7.5X6 YLW CONV (MISCELLANEOUS) ×1 IMPLANT
PATHFINDER 450CM 0.18 (STENTS) IMPLANT
POSITIONER HEAD 8X9X4 ADT (SOFTGOODS) IMPLANT
SPHINCTEROTOME AUTOTOME .25 (MISCELLANEOUS) IMPLANT
SPHINCTEROTOME HYDRATOME 44 (MISCELLANEOUS) ×2 IMPLANT
SPONGE GAUZE 4X4 12PLY (GAUZE/BANDAGES/DRESSINGS) ×1 IMPLANT
SYR 3ML LL SCALE MARK (SYRINGE) ×1 IMPLANT
SYR 50ML LL SCALE MARK (SYRINGE) ×2 IMPLANT
SYSTEM CONTINUOUS INJECTION (MISCELLANEOUS) ×1 IMPLANT
WATER STERILE IRR 1000ML POUR (IV SOLUTION) ×2 IMPLANT

## 2011-06-09 NOTE — Anesthesia Procedure Notes (Signed)
Procedure Name: Intubation Date/Time: 06/09/2011 12:55 PM Performed by: Franco Nones Pre-anesthesia Checklist: Patient identified, Patient being monitored, Timeout performed, Emergency Drugs available and Suction available Patient Re-evaluated:Patient Re-evaluated prior to inductionOxygen Delivery Method: Circle System Utilized Preoxygenation: Pre-oxygenation with 100% oxygen Intubation Type: IV induction Ventilation: Mask ventilation without difficulty Laryngoscope Size: Miller and 2 Grade View: Grade I Tube type: Oral Tube size: 7.0 mm Number of attempts: 1 Airway Equipment and Method: stylet Placement Confirmation: ETT inserted through vocal cords under direct vision,  positive ETCO2 and breath sounds checked- equal and bilateral Secured at: 21 cm Tube secured with: Tape Dental Injury: Teeth and Oropharynx as per pre-operative assessment

## 2011-06-09 NOTE — Anesthesia Preprocedure Evaluation (Signed)
Anesthesia Evaluation  Patient identified by MRN, date of birth, ID band Patient awake    Reviewed: Allergy & Precautions, H&P , NPO status , Patient's Chart, lab work & pertinent test results  History of Anesthesia Complications Negative for: history of anesthetic complications  Airway Mallampati: II  Neck ROM: Full    Dental  (+) Teeth Intact   Pulmonary neg pulmonary ROS,  breath sounds clear to auscultation        Cardiovascular negative cardio ROS  Rhythm:Regular Rate:Normal     Neuro/Psych    GI/Hepatic negative GI ROS, CBD stones, RUQ pain, n/v   Endo/Other    Renal/GU      Musculoskeletal   Abdominal   Peds  Hematology   Anesthesia Other Findings   Reproductive/Obstetrics                           Anesthesia Physical Anesthesia Plan  ASA: I  Anesthesia Plan: General   Post-op Pain Management:    Induction: Intravenous, Rapid sequence and Cricoid pressure planned  Airway Management Planned: Oral ETT  Additional Equipment:   Intra-op Plan:   Post-operative Plan: Extubation in OR  Informed Consent: I have reviewed the patients History and Physical, chart, labs and discussed the procedure including the risks, benefits and alternatives for the proposed anesthesia with the patient or authorized representative who has indicated his/her understanding and acceptance.     Plan Discussed with:   Anesthesia Plan Comments:         Anesthesia Quick Evaluation

## 2011-06-09 NOTE — Transfer of Care (Signed)
Immediate Anesthesia Transfer of Care Note  Patient: Traci Molina  Procedure(s) Performed: Procedure(s) (LRB): ENDOSCOPIC RETROGRADE CHOLANGIOPANCREATOGRAPHY (ERCP) (N/A) SPHINCTEROTOMY () BILIARY STENT PLACEMENT ()  Patient Location: PACU  Anesthesia Type: General  Level of Consciousness: awake, alert  and oriented  Airway & Oxygen Therapy: Patient Spontanous Breathing and Patient connected to face mask oxygen  Post-op Assessment: Report given to PACU RN  Post vital signs: Reviewed and stable  Complications: No apparent anesthesia complications

## 2011-06-09 NOTE — Brief Op Note (Addendum)
06/08/2011 - 06/09/2011  2:36 PM  PATIENT:  Traci Molina  46 y.o. female  PRE-OPERATIVE DIAGNOSIS:  Choledocholithiasis  POST-OPERATIVE DIAGNOSIS:  Choledocholithiasis  PROCEDURE:  Procedure(s) (LRB): ENDOSCOPIC RETROGRADE CHOLANGIOPANCREATOGRAPHY (ERCP) (N/A) SPHINCTEROTOMY () BILIARY STENT PLACEMENT ()  SURGEON:  Surgeon(s) and Role:    * Malissa Hippo, MD - Primary  ERCP note. Mildly dilated CBD with single filling defect; low takeoff of cystic duct on the left side. ES performed but unable to remove stone. 10 Fr stent placed.

## 2011-06-09 NOTE — Op Note (Signed)
Traci Molina, Traci Molina             ACCOUNT NO.:  [id number]  MEDICAL RECORD NO.:  [id number]  LOCATION:  A321                          FACILITY:  [facility]  PHYSICIAN:  Lionel December, M.D.    DATE OF BIRTH:  1965-08-03  DATE OF PROCEDURE:  06/09/2011 DATE OF DISCHARGE:                              OPERATIVE REPORT   PROCEDURE:  ERCP with biliary sphincterotomy and stenting.  INDICATION:  The patient is a 46 year old Caucasian female who presents with painful jaundice and on workup found to have choledocholithiasis in addition to cholelithiasis.  She is undergoing ERCP to clear duct stone. Procedures were reviewed with the patient and informed consent was obtained.  MEDICATIONS FOR SEDATION/ANESTHESIA:  Please see anesthesia records for details.  FINDINGS:  Procedure performed in the OR.  Once the patient was placed under anesthesia, she was intubated and placed in semiprone position. Therapeutic Pentax video duodenoscope was passed through oropharynx without any difficulty into esophagus, stomach and across the pylorus into bulb and descending duodenum.  Ampulla of Vater was identified. Had fatty texture to it.  Cannulation attempted with RX44 Autotome and 0.35 hydra wire, difficult cannulation of bile duct.  Pancreatic duct was initially cannulated with guidewire, and guidewire was left in place.  CBD selectively cannulated.  CBD was mildly dilated to 7-8 mm with very low takeoff of the cystic duct on the left side with long tortuous cystic duct.  There was single filling defect in the common bile duct.  Intrahepatic biliary radicals were normal.  Biliary sphincterotomy performed.  Unable to retrieve stone with the balloon stone extractor.  This was felt to be due to somewhat interim tortuous lower segment of bile duct and edema.  Bile was noted to be dark and mucopurulent.  A 10-French 7-cm long plastic biliary stent was placed for decompression.  Unable to place pancreatic stent  for prophylaxis because lost cannulation.  Endoscope was withdrawn.  The patient tolerated the procedure well.  She is in the process of being extubated.  FINAL DIAGNOSIS:  Mildly dilated biliary system with very low takeoff cystic located to left of hepatic duct. Single stone in the bile duct, which could not be removed following sphincterotomy.  Therefore, 10-French, 7 cm long plastic stent placed for decompression.  Mucopurulent bile suggesting cholangitis.  RECOMMENDATIONS: 1. Clear liquids. 2. Continue Invanz. 3. Can proceed with lap chole in a.m. 4. She will return for repeat ERCP in 8-12 weeks.          ______________________________ Lionel December, M.D.     NR/MEDQ  D:  06/09/2011  T:  06/09/2011  Job:  [id number]  cc:   Ernestina Penna, M.D. Fax: [phone]

## 2011-06-09 NOTE — Consult Note (Signed)
Reason for Consult: CBD stone Referring Physician: Ruba Molina is an 46 y.o. female.  HPI:  Traci Molina is 46 yr old female admitted yesterday  with epigastric and rt upper quadrant pain. She has had nausea and vomiting associated with her symptoms. Denies fever.  She has had pain off and on since January. She actually had been seen at [facility] in January for similar pain. Referred to cardiologist but has not seen yet.    Monday her pain became so bad she was seen at [facility].   She underwent an US abdomen which revealed:  06/08/2011  *RADIOLOGY REPORT*  Clinical Data:  Nausea, vomiting, abdominal pain  LIMITED ABDOMINAL ULTRASOUND - RIGHT UPPER QUADRANT  Comparison:  None  Findings:  Gallbladder:  Shadowing gallstones and sludge within gallbladder. Minimally thickened gallbladder wall.  No definite pericholecystic fluid or sonographic Murphy's sign.  Common bile duct:  Dilated, 9 mm diameter.  Liver:  Normal echogenicity without definite mass.  Intrahepatic biliary dilatation present.  Hepatopetal portal venous flow.  No right upper quadrant ascites.  IMPRESSION: Distended gallbladder containing sludge and shadowing gallstones, associated with mild gallbladder wall thickening, question early acute cholecystitis. Intrahepatic and extrahepatic biliary dilatation, CBD 9 mm diameter. Distal CBD obstruction/choledocolithiasis not excluded.                  Original Report Authenticated By: Lollie Marrow, M.D.    MRI of the abdomen revealed intrahepatic and extrahepatic biliary dilatation. CBD 9mm diameter.  Cannot exclude distal CBD obstruction.  Please see Hepatic profile below.    Past Medical History  Diagnosis Date  . No pertinent past medical history     Past Surgical History  Procedure Date  . Appendectomy   . Abdominal hysterectomy     History reviewed. No pertinent family history.  Social History:  reports that she has never smoked. She does not have any smokeless  tobacco history on file. She reports that she does not drink alcohol or use illicit drugs.  Allergies: No Known Allergies  Medications: I have reviewed the patient's current medications.  Results for orders placed during the hospital encounter of 06/08/11 (from the past 48 hour(s))  CBC     Status: Abnormal   Collection Time   06/08/11  5:50 PM      Component Value Range Comment   WBC 14.0 (*) 4.0 - 10.5 (K/uL)    RBC 4.93  3.87 - 5.11 (MIL/uL)    Hemoglobin 13.9  12.0 - 15.0 (g/dL)    HCT 16.1  09.6 - 04.5 (%)    MCV 82.6  78.0 - 100.0 (fL)    MCH 28.2  26.0 - 34.0 (pg)    MCHC 34.2  30.0 - 36.0 (g/dL)    RDW 40.9  81.1 - 91.4 (%)    Platelets 230  150 - 400 (K/uL)   DIFFERENTIAL     Status: Abnormal   Collection Time   06/08/11  5:50 PM      Component Value Range Comment   Neutrophils Relative 89 (*) 43 - 77 (%)    Neutro Abs 12.4 (*) 1.7 - 7.7 (K/uL)    Lymphocytes Relative 4 (*) 12 - 46 (%)    Lymphs Abs 0.5 (*) 0.7 - 4.0 (K/uL)    Monocytes Relative 7  3 - 12 (%)    Monocytes Absolute 1.0  0.1 - 1.0 (K/uL)    Eosinophils Relative 0  0 - 5 (%)  Eosinophils Absolute 0.0  0.0 - 0.7 (K/uL)    Basophils Relative 0  0 - 1 (%)    Basophils Absolute 0.0  0.0 - 0.1 (K/uL)   COMPREHENSIVE METABOLIC PANEL     Status: Abnormal   Collection Time   06/08/11  5:50 PM      Component Value Range Comment   Sodium 136  135 - 145 (mEq/L)    Potassium 3.4 (*) 3.5 - 5.1 (mEq/L)    Chloride 97  96 - 112 (mEq/L)    CO2 25  19 - 32 (mEq/L)    Glucose, Bld 122 (*) 70 - 99 (mg/dL)    BUN 9  6 - 23 (mg/dL)    Creatinine, Ser 8.29  0.50 - 1.10 (mg/dL)    Calcium 56.2  8.4 - 10.5 (mg/dL)    Total Protein 8.1  6.0 - 8.3 (g/dL)    Albumin 4.4  3.5 - 5.2 (g/dL)    AST 130 (*) 0 - 37 (U/L)    ALT 1278 (*) 0 - 35 (U/L)    Alkaline Phosphatase 308 (*) 39 - 117 (U/L)    Total Bilirubin 5.6 (*) 0.3 - 1.2 (mg/dL)    GFR calc non Af Amer >90  >90 (mL/min)    GFR calc Af Amer >90  >90 (mL/min)     LIPASE, BLOOD     Status: Normal   Collection Time   06/08/11  5:50 PM      Component Value Range Comment   Lipase 24  11 - 59 (U/L)   TROPONIN I     Status: Normal   Collection Time   06/08/11  5:50 PM      Component Value Range Comment   Troponin I <0.30  <0.30 (ng/mL)   BILIRUBIN, DIRECT     Status: Abnormal   Collection Time   06/08/11  5:50 PM      Component Value Range Comment   Bilirubin, Direct 3.4 (*) 0.0 - 0.3 (mg/dL)   URINALYSIS, ROUTINE W REFLEX MICROSCOPIC     Status: Abnormal   Collection Time   06/08/11  7:18 PM      Component Value Range Comment   Color, Urine ORANGE (*) YELLOW  BIOCHEMICALS MAY BE AFFECTED BY COLOR   APPearance CLEAR  CLEAR     Specific Gravity, Urine 1.020  1.005 - 1.030     pH 8.0  5.0 - 8.0     Glucose, UA NEGATIVE  NEGATIVE (mg/dL)    Hgb urine dipstick TRACE (*) NEGATIVE     Bilirubin Urine LARGE (*) NEGATIVE     Ketones, ur >80 (*) NEGATIVE (mg/dL)    Protein, ur TRACE (*) NEGATIVE (mg/dL)    Urobilinogen, UA 1.0  0.0 - 1.0 (mg/dL)    Nitrite NEGATIVE  NEGATIVE     Leukocytes, UA NEGATIVE  NEGATIVE    URINE MICROSCOPIC-ADD ON     Status: Abnormal   Collection Time   06/08/11  7:18 PM      Component Value Range Comment   Squamous Epithelial / LPF FEW (*) RARE     WBC, UA 3-6  <3 (WBC/hpf)    RBC / HPF 3-6  <3 (RBC/hpf)    Bacteria, UA FEW (*) RARE    BASIC METABOLIC PANEL     Status: Abnormal   Collection Time   06/09/11  6:01 AM      Component Value Range Comment   Sodium 136  135 - 145 (mEq/L)  Potassium 3.6  3.5 - 5.1 (mEq/L)    Chloride 98  96 - 112 (mEq/L)    CO2 28  19 - 32 (mEq/L)    Glucose, Bld 107 (*) 70 - 99 (mg/dL)    BUN 11  6 - 23 (mg/dL)    Creatinine, Ser 1.47  0.50 - 1.10 (mg/dL)    Calcium 9.7  8.4 - 10.5 (mg/dL)    GFR calc non Af Amer >90  >90 (mL/min)    GFR calc Af Amer >90  >90 (mL/min)   CBC     Status: Abnormal   Collection Time   06/09/11  6:01 AM      Component Value Range Comment   WBC 14.1 (*)  4.0 - 10.5 (K/uL)    RBC 4.66  3.87 - 5.11 (MIL/uL)    Hemoglobin 13.1  12.0 - 15.0 (g/dL)    HCT 82.9  56.2 - 13.0 (%)    MCV 84.8  78.0 - 100.0 (fL)    MCH 28.1  26.0 - 34.0 (pg)    MCHC 33.2  30.0 - 36.0 (g/dL)    RDW 86.5  78.4 - 69.6 (%)    Platelets 212  150 - 400 (K/uL)   HEPATIC FUNCTION PANEL     Status: Abnormal (Preliminary result)   Collection Time   06/09/11  6:01 AM      Component Value Range Comment   Total Protein 7.3  6.0 - 8.3 (g/dL)    Albumin 3.8  3.5 - 5.2 (g/dL)    AST 295 (*) 0 - 37 (U/L)    ALT PENDING  0 - 35 (U/L)    Alkaline Phosphatase 291 (*) 39 - 117 (U/L)    Total Bilirubin 6.6 (*) 0.3 - 1.2 (mg/dL)    Bilirubin, Direct 4.2 (*) 0.0 - 0.3 (mg/dL)    Indirect Bilirubin 2.4 (*) 0.3 - 0.9 (mg/dL)     Dg Chest 2 View  2/84/1324  *RADIOLOGY REPORT*  Clinical Data: Abdominal pain  CHEST - 2 VIEW  Comparison: 03/29/2011  Findings:  The heart size is normal.  There is no pleural effusion or edema.  No airspace consolidation identified.  Review of the visualized osseous structures is unremarkable.  IMPRESSION:  1.  No acute findings.  Original Report Authenticated By: Rosealee Albee, M.D.   Mr 3d Recon At Scanner  06/09/2011  *RADIOLOGY REPORT*  Clinical Data:  Upper abdominal pain, nausea, hyperbilirubinemia, suspected CBD stone  MRI ABDOMEN WITHOUT AND WITH CONTRAST (INCLUDING MRCP)  Technique:  Multiplanar multisequence MR imaging of the abdomen was performed both before and after the administration of intravenous contrast. Heavily T2-weighted images of the biliary and pancreatic ducts were obtained, and three-dimensional MRCP images were rendered by post processing.  Contrast: 15mL MULTIHANCE GADOBENATE DIMEGLUMINE 529 MG/ML IV SOLN  Comparison:  Ultrasound dated 06/08/2011  Findings:  Motion degraded images.  Cholelithiasis.  Mild gallbladder wall thickening/pericholecystic fluid, raising the possibility of early cholecystitis.  Associated  intrahepatic/extrahepatic ductal dilatation.  Common duct measures up to 8 mm distally and is notable for a 5 mm distal CBD stone (series 12/image 67).  Right is otherwise within normal limits.  No focal hepatic lesions. No hepatic steatosis.  Spleen, pancreas, and adrenal glands are within normal limits.  Two left renal cysts.  Right kidney is within normal limits.  No hydronephrosis.  No abdominal ascites.  No suspicious abdominal lymphadenopathy.  No focal osseous lesions.  IMPRESSION: Motion degraded images.  Cholelithiasis with mild  gallbladder wall thickening/pericholecystic fluid, raising the possibility of early cholecystitis.  Correlate with nuclear medicine hepatobiliary scintigraphy as clinically warranted.  Choledocholithiasis with intrahepatic/extrahepatic ductal dilatation.  Common duct measures up to 8 mm distally and is notable for a 5 mm distal CBD stone.  Original Report Authenticated By: Charline Bills, M.D.   Mr Abd W/wo Cm/mrcp  06/09/2011  *RADIOLOGY REPORT*  Clinical Data:  Upper abdominal pain, nausea, hyperbilirubinemia, suspected CBD stone  MRI ABDOMEN WITHOUT AND WITH CONTRAST (INCLUDING MRCP)  Technique:  Multiplanar multisequence MR imaging of the abdomen was performed both before and after the administration of intravenous contrast. Heavily T2-weighted images of the biliary and pancreatic ducts were obtained, and three-dimensional MRCP images were rendered by post processing.  Contrast: 15mL MULTIHANCE GADOBENATE DIMEGLUMINE 529 MG/ML IV SOLN  Comparison:  Ultrasound dated 06/08/2011  Findings:  Motion degraded images.  Cholelithiasis.  Mild gallbladder wall thickening/pericholecystic fluid, raising the possibility of early cholecystitis.  Associated intrahepatic/extrahepatic ductal dilatation.  Common duct measures up to 8 mm distally and is notable for a 5 mm distal CBD stone (series 12/image 67).  Right is otherwise within normal limits.  No focal hepatic lesions. No hepatic  steatosis.  Spleen, pancreas, and adrenal glands are within normal limits.  Two left renal cysts.  Right kidney is within normal limits.  No hydronephrosis.  No abdominal ascites.  No suspicious abdominal lymphadenopathy.  No focal osseous lesions.  IMPRESSION: Motion degraded images.  Cholelithiasis with mild gallbladder wall thickening/pericholecystic fluid, raising the possibility of early cholecystitis.  Correlate with nuclear medicine hepatobiliary scintigraphy as clinically warranted.  Choledocholithiasis with intrahepatic/extrahepatic ductal dilatation.  Common duct measures up to 8 mm distally and is notable for a 5 mm distal CBD stone.  Original Report Authenticated By: Charline Bills, M.D.   US Abdomen Limited Ruq  06/08/2011  *RADIOLOGY REPORT*  Clinical Data:  Nausea, vomiting, abdominal pain  LIMITED ABDOMINAL ULTRASOUND - RIGHT UPPER QUADRANT  Comparison:  None  Findings:  Gallbladder:  Shadowing gallstones and sludge within gallbladder. Minimally thickened gallbladder wall.  No definite pericholecystic fluid or sonographic Murphy's sign.  Common bile duct:  Dilated, 9 mm diameter.  Liver:  Normal echogenicity without definite mass.  Intrahepatic biliary dilatation present.  Hepatopetal portal venous flow.  No right upper quadrant ascites.  IMPRESSION: Distended gallbladder containing sludge and shadowing gallstones, associated with mild gallbladder wall thickening, question early acute cholecystitis. Intrahepatic and extrahepatic biliary dilatation, CBD 9 mm diameter. Distal CBD obstruction/choledocolithiasis not excluded.                   Original Report Authenticated By: Lollie Marrow, M.D.    Review of Systems  Constitutional: Negative for fever, chills and weight loss.  Gastrointestinal: Positive for abdominal pain.   Blood pressure 119/73, pulse 106, temperature 98.7 F (37.1 C), temperature source Oral, resp. rate 18, height 5\' 11"  (1.803 m), weight 188 lb 8 oz (85.503 kg), SpO2  92.00%. Physical ExamAlert and oriented. Skin warm and dry. Oral mucosa is moist.   . Sclera anicteric, conjunctivae is pink. Thyroid not enlarged. No cervical lymphadenopathy. Lungs clear. Heart regular rate and rhythm.  Abdomen is soft. Bowel sounds are positive. No hepatomegaly. No abdominal masses felt. Tenderness epigastric region and rt upper quadrant.  No edema to lower extremities. Patient is alert and oriented.   Assessment/Plan: Choledocholithiasis.  ERCP today. I discussed this case with Dr. Karilyn Cota.  SETZER,TERRI W 06/09/2011, 9:00 AM     GI attending note. Patient  interviewed and examined. MRCP reviewed. Patient has single stone in the bile duct. She will need ERCP with sphincterotomy and stone extraction. Reviewed the procedure and risks with the patient and her husband and they're agreeable. Procedure would be performed later today.

## 2011-06-09 NOTE — Anesthesia Postprocedure Evaluation (Signed)
  Anesthesia Post-op Note  Patient: Traci Molina  Procedure(s) Performed: Procedure(s) (LRB): ENDOSCOPIC RETROGRADE CHOLANGIOPANCREATOGRAPHY (ERCP) (N/A) SPHINCTEROTOMY () BILIARY STENT PLACEMENT ()  Patient Location: PACU  Anesthesia Type: General  Level of Consciousness: awake, alert  and oriented  Airway and Oxygen Therapy: Patient Spontanous Breathing  Post-op Pain: none  Post-op Assessment: Post-op Vital signs reviewed, Patient's Cardiovascular Status Stable, Respiratory Function Stable, Patent Airway and No signs of Nausea or vomiting  Post-op Vital Signs: Reviewed and stable  Complications: No apparent anesthesia complications

## 2011-06-09 NOTE — Progress Notes (Signed)
Day of Surgery  Subjective: Patient seen in pre-op prior to ERCP.  Still with some nausea.  Better controlled.  Pain controlled.  Objective: Vital signs in last 24 hours: Temp:  [97.9 F (36.6 C)-100.1 F (37.8 C)] 98.8 F (37.1 C) (03/20 1145) Pulse Rate:  [80-106] 101  (03/20 1145) Resp:  [16-51] 51  (03/20 1225) BP: (118-167)/(73-97) 124/73 mmHg (03/20 1225) SpO2:  [83 %-98 %] 83 % (03/20 1225) Weight:  [78.019 kg (172 lb)-85.503 kg (188 lb 8 oz)] 85.503 kg (188 lb 8 oz) (03/19 2202) Last BM Date: 06/05/11  Intake/Output from previous day:   Intake/Output this shift: Total I/O In: 1000 [I.V.:1000] Out: 0   General appearance: alert, icteric and no distress GI: soft, RUQ pain.  No peritoneal signs.  Lab Results:   Basename 06/09/11 0601 06/08/11 1750  WBC 14.1* 14.0*  HGB 13.1 13.9  HCT 39.5 40.7  PLT 212 230   BMET  Basename 06/09/11 0601 06/08/11 1750  NA 136 136  K 3.6 3.4*  CL 98 97  CO2 28 25  GLUCOSE 107* 122*  BUN 11 9  CREATININE 0.72 0.61  CALCIUM 9.7 10.3   PT/INR No results found for this basename: LABPROT:2,INR:2 in the last 72 hours ABG No results found for this basename: PHART:2,PCO2:2,PO2:2,HCO3:2 in the last 72 hours  Studies/Results: Dg Chest 2 View  06/08/2011  *RADIOLOGY REPORT*  Clinical Data: Abdominal pain  CHEST - 2 VIEW  Comparison: 03/29/2011  Findings:  The heart size is normal.  There is no pleural effusion or edema.  No airspace consolidation identified.  Review of the visualized osseous structures is unremarkable.  IMPRESSION:  1.  No acute findings.  Original Report Authenticated By: Rosealee Albee, M.D.   Mr 3d Recon At Scanner  06/09/2011  *RADIOLOGY REPORT*  Clinical Data:  Upper abdominal pain, nausea, hyperbilirubinemia, suspected CBD stone  MRI ABDOMEN WITHOUT AND WITH CONTRAST (INCLUDING MRCP)  Technique:  Multiplanar multisequence MR imaging of the abdomen was performed both before and after the administration of  intravenous contrast. Heavily T2-weighted images of the biliary and pancreatic ducts were obtained, and three-dimensional MRCP images were rendered by post processing.  Contrast: 15mL MULTIHANCE GADOBENATE DIMEGLUMINE 529 MG/ML IV SOLN  Comparison:  Ultrasound dated 06/08/2011  Findings:  Motion degraded images.  Cholelithiasis.  Mild gallbladder wall thickening/pericholecystic fluid, raising the possibility of early cholecystitis.  Associated intrahepatic/extrahepatic ductal dilatation.  Common duct measures up to 8 mm distally and is notable for a 5 mm distal CBD stone (series 12/image 67).  Right is otherwise within normal limits.  No focal hepatic lesions. No hepatic steatosis.  Spleen, pancreas, and adrenal glands are within normal limits.  Two left renal cysts.  Right kidney is within normal limits.  No hydronephrosis.  No abdominal ascites.  No suspicious abdominal lymphadenopathy.  No focal osseous lesions.  IMPRESSION: Motion degraded images.  Cholelithiasis with mild gallbladder wall thickening/pericholecystic fluid, raising the possibility of early cholecystitis.  Correlate with nuclear medicine hepatobiliary scintigraphy as clinically warranted.  Choledocholithiasis with intrahepatic/extrahepatic ductal dilatation.  Common duct measures up to 8 mm distally and is notable for a 5 mm distal CBD stone.  Original Report Authenticated By: Charline Bills, M.D.   Mr Abd W/wo Cm/mrcp  06/09/2011  *RADIOLOGY REPORT*  Clinical Data:  Upper abdominal pain, nausea, hyperbilirubinemia, suspected CBD stone  MRI ABDOMEN WITHOUT AND WITH CONTRAST (INCLUDING MRCP)  Technique:  Multiplanar multisequence MR imaging of the abdomen was performed both before and  after the administration of intravenous contrast. Heavily T2-weighted images of the biliary and pancreatic ducts were obtained, and three-dimensional MRCP images were rendered by post processing.  Contrast: 15mL MULTIHANCE GADOBENATE DIMEGLUMINE 529 MG/ML IV SOLN   Comparison:  Ultrasound dated 06/08/2011  Findings:  Motion degraded images.  Cholelithiasis.  Mild gallbladder wall thickening/pericholecystic fluid, raising the possibility of early cholecystitis.  Associated intrahepatic/extrahepatic ductal dilatation.  Common duct measures up to 8 mm distally and is notable for a 5 mm distal CBD stone (series 12/image 67).  Right is otherwise within normal limits.  No focal hepatic lesions. No hepatic steatosis.  Spleen, pancreas, and adrenal glands are within normal limits.  Two left renal cysts.  Right kidney is within normal limits.  No hydronephrosis.  No abdominal ascites.  No suspicious abdominal lymphadenopathy.  No focal osseous lesions.  IMPRESSION: Motion degraded images.  Cholelithiasis with mild gallbladder wall thickening/pericholecystic fluid, raising the possibility of early cholecystitis.  Correlate with nuclear medicine hepatobiliary scintigraphy as clinically warranted.  Choledocholithiasis with intrahepatic/extrahepatic ductal dilatation.  Common duct measures up to 8 mm distally and is notable for a 5 mm distal CBD stone.  Original Report Authenticated By: Charline Bills, M.D.   US Abdomen Limited Ruq  06/08/2011  *RADIOLOGY REPORT*  Clinical Data:  Nausea, vomiting, abdominal pain  LIMITED ABDOMINAL ULTRASOUND - RIGHT UPPER QUADRANT  Comparison:  None  Findings:  Gallbladder:  Shadowing gallstones and sludge within gallbladder. Minimally thickened gallbladder wall.  No definite pericholecystic fluid or sonographic Murphy's sign.  Common bile duct:  Dilated, 9 mm diameter.  Liver:  Normal echogenicity without definite mass.  Intrahepatic biliary dilatation present.  Hepatopetal portal venous flow.  No right upper quadrant ascites.  IMPRESSION: Distended gallbladder containing sludge and shadowing gallstones, associated with mild gallbladder wall thickening, question early acute cholecystitis. Intrahepatic and extrahepatic biliary dilatation, CBD 9 mm  diameter. Distal CBD obstruction/choledocolithiasis not excluded.                   Original Report Authenticated By: Lollie Marrow, M.D.    Anti-infectives: Anti-infectives     Start     Dose/Rate Route Frequency Ordered Stop   06/09/11 1800   ertapenem (INVANZ) 1 g in sodium chloride 0.9 % 50 mL IVPB        1 g 100 mL/hr over 30 Minutes Intravenous Every 24 hours 06/08/11 2029     06/08/11 1915   ertapenem (INVANZ) 1 g in sodium chloride 0.9 % 50 mL IVPB        1 g 100 mL/hr over 30 Minutes Intravenous  Once 06/08/11 1911 06/08/11 2002          Assessment/Plan: s/p Procedure(s) (LRB): ENDOSCOPIC RETROGRADE CHOLANGIOPANCREATOGRAPHY (ERCP) (N/A) SPHINCTEROTOMY () BILIARY STENT PLACEMENT () Continue npo.  continue to monitor for improvement of hepatic enzymes.  When near normal will plan to proceed for definitive chole.  LOS: 1 day    Banessa Mao C 06/09/2011

## 2011-06-10 LAB — COMPREHENSIVE METABOLIC PANEL
Albumin: 3.1 g/dL — ABNORMAL LOW (ref 3.5–5.2)
Alkaline Phosphatase: 223 U/L — ABNORMAL HIGH (ref 39–117)
BUN: 11 mg/dL (ref 6–23)
Calcium: 9.3 mg/dL (ref 8.4–10.5)
Creatinine, Ser: 0.68 mg/dL (ref 0.50–1.10)
GFR calc Af Amer: >90 mL/min (ref >90)
Glucose, Bld: 111 mg/dL — ABNORMAL HIGH (ref 70–99)
Potassium: 3.8 mEq/L (ref 3.5–5.1)
Total Protein: 6.2 g/dL (ref 6.0–8.3)

## 2011-06-10 LAB — CBC
HCT: 33.2 % — ABNORMAL LOW (ref 36.0–46.0)
MCH: 28.2 pg (ref 26.0–34.0)
MCHC: 32.8 g/dL (ref 30.0–36.0)
RDW: 12.9 % (ref 11.5–15.5)

## 2011-06-10 LAB — AMYLASE: Amylase: 31 U/L (ref 0–105)

## 2011-06-10 MED ORDER — SODIUM CHLORIDE 0.9 % IJ SOLN
INTRAMUSCULAR | Status: AC
  Filled 2011-06-10: qty 3

## 2011-06-10 MED ORDER — DIPHENHYDRAMINE HCL 25 MG PO CAPS
25.0000 mg | ORAL_CAPSULE | Freq: Once | ORAL | Status: AC
  Administered 2011-06-10: 25 mg via ORAL
  Filled 2011-06-10: qty 1

## 2011-06-10 NOTE — Addendum Note (Signed)
Addendum  created 06/10/11 1610 by Franco Nones, CRNA   Modules edited:Charges VN, Notes Section

## 2011-06-10 NOTE — Progress Notes (Signed)
1 Day Post-Op  Subjective: Feels better than yesterday.  Pain improved.  No nausea.  No fever or chills.  Objective: Vital signs in last 24 hours: Temp:  [98.2 F (36.8 C)-98.5 F (36.9 C)] 98.5 F (36.9 C) (03/21 1400) Pulse Rate:  [71-80] 80  (03/21 1400) Resp:  [20] 20  (03/21 1400) BP: (108-119)/(66-73) 119/71 mmHg (03/21 1400) SpO2:  [93 %-99 %] 99 % (03/21 1400) Last BM Date: 06/05/11  Intake/Output from previous day: 03/20 0701 - 03/21 0700 In: 2333.3 [P.O.:240; I.V.:2093.3] Out: 0  Intake/Output this shift:    General appearance: alert and no distress GI: Soft, mild-moderate tenderness.  No peritoneal signs.  Lab Results:   Basename 06/10/11 0544 06/09/11 0601  WBC 7.9 14.1*  HGB 10.9* 13.1  HCT 33.2* 39.5  PLT 155 212   BMET  Basename 06/10/11 0544 06/09/11 0601  NA 140 136  K 3.8 3.6  CL 103 98  CO2 30 28  GLUCOSE 111* 107*  BUN 11 11  CREATININE 0.68 0.72  CALCIUM 9.3 9.7   PT/INR No results found for this basename: LABPROT:2,INR:2 in the last 72 hours ABG No results found for this basename: PHART:2,PCO2:2,PO2:2,HCO3:2 in the last 72 hours  Studies/Results: Mr 3d Recon At Scanner  06/09/2011  *RADIOLOGY REPORT*  Clinical Data:  Upper abdominal pain, nausea, hyperbilirubinemia, suspected CBD stone  MRI ABDOMEN WITHOUT AND WITH CONTRAST (INCLUDING MRCP)  Technique:  Multiplanar multisequence MR imaging of the abdomen was performed both before and after the administration of intravenous contrast. Heavily T2-weighted images of the biliary and pancreatic ducts were obtained, and three-dimensional MRCP images were rendered by post processing.  Contrast: 15mL MULTIHANCE GADOBENATE DIMEGLUMINE 529 MG/ML IV SOLN  Comparison:  Ultrasound dated 06/08/2011  Findings:  Motion degraded images.  Cholelithiasis.  Mild gallbladder wall thickening/pericholecystic fluid, raising the possibility of early cholecystitis.  Associated intrahepatic/extrahepatic ductal  dilatation.  Common duct measures up to 8 mm distally and is notable for a 5 mm distal CBD stone (series 12/image 67).  Right is otherwise within normal limits.  No focal hepatic lesions. No hepatic steatosis.  Spleen, pancreas, and adrenal glands are within normal limits.  Two left renal cysts.  Right kidney is within normal limits.  No hydronephrosis.  No abdominal ascites.  No suspicious abdominal lymphadenopathy.  No focal osseous lesions.  IMPRESSION: Motion degraded images.  Cholelithiasis with mild gallbladder wall thickening/pericholecystic fluid, raising the possibility of early cholecystitis.  Correlate with nuclear medicine hepatobiliary scintigraphy as clinically warranted.  Choledocholithiasis with intrahepatic/extrahepatic ductal dilatation.  Common duct measures up to 8 mm distally and is notable for a 5 mm distal CBD stone.  Original Report Authenticated By: Charline Bills, M.D.   Dg Ercp With Sphincterotomy  06/09/2011  *RADIOLOGY REPORT*  Clinical Data: Common bile duct stone.  Cholelithiasis.  ERCP  Comparison:  MRCP  Findings: Multiple C-arm images are provided.  There is cannulation of the common bile duct.  Images show performance of sphincterotomy.  Balloon pull through was performed.  Common duct stent is placed.  IMPRESSION: Sphincterotomy, balloon pull through and stent placement.  Original Report Authenticated By: Thomasenia Sales, M.D.   Mr Abd W/wo Cm/mrcp  06/09/2011  *RADIOLOGY REPORT*  Clinical Data:  Upper abdominal pain, nausea, hyperbilirubinemia, suspected CBD stone  MRI ABDOMEN WITHOUT AND WITH CONTRAST (INCLUDING MRCP)  Technique:  Multiplanar multisequence MR imaging of the abdomen was performed both before and after the administration of intravenous contrast. Heavily T2-weighted images of the biliary  and pancreatic ducts were obtained, and three-dimensional MRCP images were rendered by post processing.  Contrast: 15mL MULTIHANCE GADOBENATE DIMEGLUMINE 529 MG/ML IV SOLN   Comparison:  Ultrasound dated 06/08/2011  Findings:  Motion degraded images.  Cholelithiasis.  Mild gallbladder wall thickening/pericholecystic fluid, raising the possibility of early cholecystitis.  Associated intrahepatic/extrahepatic ductal dilatation.  Common duct measures up to 8 mm distally and is notable for a 5 mm distal CBD stone (series 12/image 67).  Right is otherwise within normal limits.  No focal hepatic lesions. No hepatic steatosis.  Spleen, pancreas, and adrenal glands are within normal limits.  Two left renal cysts.  Right kidney is within normal limits.  No hydronephrosis.  No abdominal ascites.  No suspicious abdominal lymphadenopathy.  No focal osseous lesions.  IMPRESSION: Motion degraded images.  Cholelithiasis with mild gallbladder wall thickening/pericholecystic fluid, raising the possibility of early cholecystitis.  Correlate with nuclear medicine hepatobiliary scintigraphy as clinically warranted.  Choledocholithiasis with intrahepatic/extrahepatic ductal dilatation.  Common duct measures up to 8 mm distally and is notable for a 5 mm distal CBD stone.  Original Report Authenticated By: Charline Bills, M.D.    Anti-infectives: Anti-infectives     Start     Dose/Rate Route Frequency Ordered Stop   06/09/11 1800   ertapenem (INVANZ) 1 g in sodium chloride 0.9 % 50 mL IVPB        1 g 100 mL/hr over 30 Minutes Intravenous Every 24 hours 06/08/11 2029     06/08/11 1915   ertapenem (INVANZ) 1 g in sodium chloride 0.9 % 50 mL IVPB        1 g 100 mL/hr over 30 Minutes Intravenous  Once 06/08/11 1911 06/08/11 2002          Assessment/Plan: s/p Procedure(s) (LRB): ENDOSCOPIC RETROGRADE CHOLANGIOPANCREATOGRAPHY (ERCP) (N/A) SPHINCTEROTOMY () BILIARY STENT PLACEMENT () Plan OR tomorrow.  Continue full liquids.  NPO tonight.  Again discussed surgical plans with patient.    LOS: 2 days    Anjel Perfetti C 06/10/2011

## 2011-06-10 NOTE — Progress Notes (Signed)
   CARE MANAGEMENT NOTE 06/10/2011  Patient:  Traci Molina, Traci Molina   Account Number:  [id number]  Date Initiated:  06/10/2011  Documentation initiated by:  Sharrie Rothman  Subjective/Objective Assessment:   Pt admitted from home with cholecystitis. Pt lives with husband.     Action/Plan:   No CM needs noted. Pt will be having surgery on 06/11/11.   Anticipated DC Date:  06/17/2011   Anticipated DC Plan:  HOME/SELF CARE      DC Planning Services  CM consult      Choice offered to / List presented to:             Status of service:  Completed, signed off Medicare Important Message given?   (If response is "NO", the following Medicare IM given date fields will be blank) Date Medicare IM given:   Date Additional Medicare IM given:    Discharge Disposition:    Per UR Regulation:    If discussed at Long Length of Stay Meetings, dates discussed:    Comments:  06/10/11 1455 Arlyss Queen, RN BSN CM Pt admitted from home with husband. No CM needs noted.

## 2011-06-10 NOTE — Anesthesia Postprocedure Evaluation (Signed)
Anesthesia Post Note  Patient: Traci Molina  Procedure(s) Performed: Procedure(s) (LRB): ENDOSCOPIC RETROGRADE CHOLANGIOPANCREATOGRAPHY (ERCP) (N/A) SPHINCTEROTOMY () BILIARY STENT PLACEMENT ()  Anesthesia type: General  Patient location: 321  Post pain: Pain level controlled  Post assessment: Post-op Vital signs reviewed, Patient's Cardiovascular Status Stable, Respiratory Function Stable, Patent Airway, No signs of Nausea or vomiting and Pain level controlled  Last Vitals:  Filed Vitals:   06/10/11 0656  BP: 108/66  Pulse: 71  Temp: 36.8 C  Resp: 20    Post vital signs: Reviewed and stable  Level of consciousness: awake and alert   Complications: No apparent anesthesia complications

## 2011-06-10 NOTE — Progress Notes (Signed)
Patient seen earlier in the day. Patient feels much better. Now she has soreness. She has a good appetite and she denies dysphagia. Her husband has noted her to be less jaundiced. She is afebrile. Her abdomen is soft with mild midepigastric tenderness. Serum amylase is normal. Bilirubin and transaminases are coming down. Diet advanced after discussion with Dr. Leticia Penna. She is for laparoscopic cholecystectomy in a.m. To have repeat LFTs in a.m.

## 2011-06-11 ENCOUNTER — Encounter (HOSPITAL_COMMUNITY): Payer: Self-pay | Admitting: *Deleted

## 2011-06-11 ENCOUNTER — Encounter (HOSPITAL_COMMUNITY)
Admission: EM | Disposition: A | Discharge status: Home / Self Care | Payer: Self-pay | Source: Home / Self Care | Attending: Emergency Medicine

## 2011-06-11 ENCOUNTER — Encounter (HOSPITAL_COMMUNITY): Payer: Self-pay | Admitting: Anesthesiology

## 2011-06-11 ENCOUNTER — Inpatient Hospital Stay (HOSPITAL_COMMUNITY): Payer: BC Managed Care – PPO | Admitting: Anesthesiology

## 2011-06-11 HISTORY — PX: CHOLECYSTECTOMY: SHX55

## 2011-06-11 LAB — HEPATIC FUNCTION PANEL
ALT: 335 U/L — ABNORMAL HIGH (ref 0–35)
AST: 54 U/L — ABNORMAL HIGH (ref 0–37)
Albumin: 3.1 g/dL — ABNORMAL LOW (ref 3.5–5.2)
Total Protein: 6.4 g/dL (ref 6.0–8.3)

## 2011-06-11 LAB — SURGICAL PCR SCREEN: Staphylococcus aureus: NEGATIVE

## 2011-06-11 SURGERY — LAPAROSCOPIC CHOLECYSTECTOMY
Anesthesia: General | Site: Abdomen | Wound class: Contaminated

## 2011-06-11 MED ORDER — HYDROCODONE-ACETAMINOPHEN 5-325 MG PO TABS: 1.0000 | ORAL_TABLET | ORAL | Status: DC | PRN

## 2011-06-11 MED ORDER — HEMOSTATIC AGENTS (NO CHARGE) OPTIME
TOPICAL | Status: DC | PRN
  Administered 2011-06-11: 1 via TOPICAL

## 2011-06-11 MED ORDER — HYDROCODONE-ACETAMINOPHEN 5-325 MG PO TABS: 1.0000 | ORAL_TABLET | ORAL | Status: AC | PRN

## 2011-06-11 MED ORDER — LIDOCAINE HCL (PF) 1 % IJ SOLN
INTRAMUSCULAR | Status: AC
  Filled 2011-06-11: qty 5

## 2011-06-11 MED ORDER — MIDAZOLAM HCL 2 MG/2ML IJ SOLN
INTRAMUSCULAR | Status: AC
  Filled 2011-06-11: qty 2

## 2011-06-11 MED ORDER — FENTANYL CITRATE 0.05 MG/ML IJ SOLN
25.0000 ug | INTRAMUSCULAR | Status: DC | PRN
  Administered 2011-06-11 (×4): 50 ug via INTRAVENOUS

## 2011-06-11 MED ORDER — ROCURONIUM BROMIDE 50 MG/5ML IV SOLN
INTRAVENOUS | Status: AC
  Filled 2011-06-11: qty 1

## 2011-06-11 MED ORDER — DIPHENHYDRAMINE HCL 25 MG PO CAPS
25.0000 mg | ORAL_CAPSULE | Freq: Four times a day (QID) | ORAL | Status: DC | PRN
  Administered 2011-06-11: 25 mg via ORAL
  Filled 2011-06-11: qty 1

## 2011-06-11 MED ORDER — BUPIVACAINE HCL (PF) 0.5 % IJ SOLN
INTRAMUSCULAR | Status: AC
  Filled 2011-06-11: qty 30

## 2011-06-11 MED ORDER — FENTANYL CITRATE 0.05 MG/ML IJ SOLN
INTRAMUSCULAR | Status: AC
  Administered 2011-06-11: 50 ug via INTRAVENOUS
  Filled 2011-06-11: qty 2

## 2011-06-11 MED ORDER — FENTANYL CITRATE 0.05 MG/ML IJ SOLN
INTRAMUSCULAR | Status: AC
  Filled 2011-06-11: qty 2

## 2011-06-11 MED ORDER — SODIUM CHLORIDE 0.9 % IJ SOLN
INTRAMUSCULAR | Status: AC
  Filled 2011-06-11: qty 3

## 2011-06-11 MED ORDER — MIDAZOLAM HCL 2 MG/2ML IJ SOLN
INTRAMUSCULAR | Status: AC
  Administered 2011-06-11: 2 mg via INTRAVENOUS
  Filled 2011-06-11: qty 2

## 2011-06-11 MED ORDER — PROPOFOL 10 MG/ML IV EMUL
INTRAVENOUS | Status: AC
  Filled 2011-06-11: qty 20

## 2011-06-11 MED ORDER — NEOSTIGMINE METHYLSULFATE 1 MG/ML IJ SOLN
INTRAMUSCULAR | Status: AC
  Filled 2011-06-11: qty 10

## 2011-06-11 MED ORDER — ONDANSETRON HCL 4 MG/2ML IJ SOLN
4.0000 mg | Freq: Once | INTRAMUSCULAR | Status: AC
  Administered 2011-06-11: 4 mg via INTRAVENOUS

## 2011-06-11 MED ORDER — GLYCOPYRROLATE 0.2 MG/ML IJ SOLN
INTRAMUSCULAR | Status: DC | PRN
  Administered 2011-06-11: .4 mg via INTRAVENOUS

## 2011-06-11 MED ORDER — ROCURONIUM BROMIDE 100 MG/10ML IV SOLN
INTRAVENOUS | Status: DC | PRN
  Administered 2011-06-11: 35 mg via INTRAVENOUS
  Administered 2011-06-11: 5 mg via INTRAVENOUS

## 2011-06-11 MED ORDER — ACETAMINOPHEN 325 MG PO TABS: 325.0000 mg | ORAL_TABLET | ORAL | Status: DC | PRN

## 2011-06-11 MED ORDER — LIDOCAINE HCL (CARDIAC) 10 MG/ML IV SOLN
INTRAVENOUS | Status: DC | PRN
  Administered 2011-06-11: 10 mg via INTRAVENOUS

## 2011-06-11 MED ORDER — FENTANYL CITRATE 0.05 MG/ML IJ SOLN
INTRAMUSCULAR | Status: DC | PRN
  Administered 2011-06-11 (×2): 50 ug via INTRAVENOUS
  Administered 2011-06-11: 100 ug via INTRAVENOUS
  Administered 2011-06-11 (×2): 50 ug via INTRAVENOUS
  Administered 2011-06-11 (×2): 25 ug via INTRAVENOUS
  Administered 2011-06-11: 50 ug via INTRAVENOUS

## 2011-06-11 MED ORDER — GLYCOPYRROLATE 0.2 MG/ML IJ SOLN
INTRAMUSCULAR | Status: AC
  Filled 2011-06-11: qty 1

## 2011-06-11 MED ORDER — GLYCOPYRROLATE 0.2 MG/ML IJ SOLN
0.2000 mg | Freq: Once | INTRAMUSCULAR | Status: AC
  Administered 2011-06-11: 0.2 mg via INTRAVENOUS

## 2011-06-11 MED ORDER — ONDANSETRON HCL 4 MG/2ML IJ SOLN
INTRAMUSCULAR | Status: AC
  Administered 2011-06-11: 4 mg via INTRAVENOUS
  Filled 2011-06-11: qty 2

## 2011-06-11 MED ORDER — MIDAZOLAM HCL 2 MG/2ML IJ SOLN
1.0000 mg | INTRAMUSCULAR | Status: DC | PRN
  Administered 2011-06-11: 2 mg via INTRAVENOUS

## 2011-06-11 MED ORDER — ONDANSETRON HCL 4 MG/2ML IJ SOLN: 4.0000 mg | Freq: Once | INTRAMUSCULAR | Status: DC | PRN

## 2011-06-11 MED ORDER — PROPOFOL 10 MG/ML IV EMUL
INTRAVENOUS | Status: DC | PRN
  Administered 2011-06-11: 150 mg via INTRAVENOUS
  Administered 2011-06-11: 50 mg via INTRAVENOUS

## 2011-06-11 MED ORDER — SODIUM CHLORIDE 0.9 % IR SOLN
Status: DC | PRN
  Administered 2011-06-11: 3000 mL
  Administered 2011-06-11: 50 mL

## 2011-06-11 MED ORDER — GLYCOPYRROLATE 0.2 MG/ML IJ SOLN
INTRAMUSCULAR | Status: AC
  Administered 2011-06-11: 0.2 mg via INTRAVENOUS
  Filled 2011-06-11: qty 1

## 2011-06-11 MED ORDER — NEOSTIGMINE METHYLSULFATE 1 MG/ML IJ SOLN
INTRAMUSCULAR | Status: DC | PRN
  Administered 2011-06-11: 2 mg via INTRAVENOUS

## 2011-06-11 MED ORDER — BUPIVACAINE HCL (PF) 0.5 % IJ SOLN
INTRAMUSCULAR | Status: DC | PRN
  Administered 2011-06-11: 10 mL

## 2011-06-11 MED ORDER — HYDROMORPHONE HCL PF 1 MG/ML IJ SOLN
1.0000 mg | INTRAMUSCULAR | Status: DC | PRN
Start: 2011-06-11 — End: 2011-06-12
  Administered 2011-06-11 (×2): 2 mg via INTRAVENOUS
  Filled 2011-06-11 (×2): qty 2

## 2011-06-11 MED ORDER — CELECOXIB 200 MG PO CAPS
200.0000 mg | ORAL_CAPSULE | Freq: Two times a day (BID) | ORAL | Status: DC
  Administered 2011-06-11 – 2011-06-12 (×2): 200 mg via ORAL
  Filled 2011-06-11 (×6): qty 1

## 2011-06-11 MED ORDER — ENOXAPARIN SODIUM 40 MG/0.4ML ~~LOC~~ SOLN
40.0000 mg | Freq: Once | SUBCUTANEOUS | Status: AC
  Administered 2011-06-11: 40 mg via SUBCUTANEOUS
  Filled 2011-06-11: qty 0.4

## 2011-06-11 MED ORDER — ACETAMINOPHEN 325 MG PO TABS: 650.0000 mg | ORAL_TABLET | ORAL | Status: DC | PRN

## 2011-06-11 MED ORDER — LACTATED RINGERS IV SOLN
INTRAVENOUS | Status: DC
  Administered 2011-06-11: 1000 mL via INTRAVENOUS

## 2011-06-11 SURGICAL SUPPLY — 44 items
APL SKNCLS STERI-STRIP NONHPOA (GAUZE/BANDAGES/DRESSINGS) ×1
APPLIER CLIP UNV 5X34 EPIX (ENDOMECHANICALS) ×2 IMPLANT
APR XCLPCLP 20M/L UNV 34X5 (ENDOMECHANICALS) ×1
BAG HAMPER (MISCELLANEOUS) ×2 IMPLANT
BAG SPEC RTRVL LRG 6X4 10 (ENDOMECHANICALS) ×1
BENZOIN TINCTURE PRP APPL 2/3 (GAUZE/BANDAGES/DRESSINGS) ×2 IMPLANT
CLOTH BEACON ORANGE TIMEOUT ST (SAFETY) ×2 IMPLANT
COVER SURGICAL LIGHT HANDLE (MISCELLANEOUS) ×4 IMPLANT
DECANTER SPIKE VIAL GLASS SM (MISCELLANEOUS) ×2 IMPLANT
DEVICE TROCAR PUNCTURE CLOSURE (ENDOMECHANICALS) ×2 IMPLANT
DURAPREP 26ML APPLICATOR (WOUND CARE) ×2 IMPLANT
ELECT REM PT RETURN 9FT ADLT (ELECTROSURGICAL) ×2
ELECTRODE REM PT RTRN 9FT ADLT (ELECTROSURGICAL) ×1 IMPLANT
FILTER SMOKE EVAC LAPAROSHD (FILTER) ×2 IMPLANT
FORMALIN 10 PREFIL 120ML (MISCELLANEOUS) ×1 IMPLANT
FORMALIN 10 PREFIL 480ML (MISCELLANEOUS) ×1 IMPLANT
GLOVE BIOGEL PI IND STRL 7.0 (GLOVE) IMPLANT
GLOVE BIOGEL PI IND STRL 7.5 (GLOVE) ×1 IMPLANT
GLOVE BIOGEL PI IND STRL 8 (GLOVE) IMPLANT
GLOVE BIOGEL PI INDICATOR 7.0 (GLOVE) ×1
GLOVE BIOGEL PI INDICATOR 7.5 (GLOVE) ×1
GLOVE BIOGEL PI INDICATOR 8 (GLOVE) ×1
GLOVE ECLIPSE 7.0 STRL STRAW (GLOVE) ×2 IMPLANT
GLOVE SS BIOGEL STRL SZ 6.5 (GLOVE) IMPLANT
GLOVE SS BIOGEL STRL SZ 8 (GLOVE) IMPLANT
GLOVE SUPERSENSE BIOGEL SZ 6.5 (GLOVE) ×1
GLOVE SUPERSENSE BIOGEL SZ 8 (GLOVE) ×2
GOWN STRL REIN XL XLG (GOWN DISPOSABLE) ×6 IMPLANT
HEMOSTAT SNOW SURGICEL 2X4 (HEMOSTASIS) ×1 IMPLANT
INST SET LAPROSCOPIC AP (KITS) ×2 IMPLANT
IV NS IRRIG 3000ML ARTHROMATIC (IV SOLUTION) ×2 IMPLANT
KIT ROOM TURNOVER APOR (KITS) ×2 IMPLANT
KIT TROCAR LAP CHOLE (TROCAR) ×2 IMPLANT
MANIFOLD NEPTUNE II (INSTRUMENTS) ×2 IMPLANT
PACK LAP CHOLE LZT030E (CUSTOM PROCEDURE TRAY) ×2 IMPLANT
PAD ARMBOARD 7.5X6 YLW CONV (MISCELLANEOUS) ×2 IMPLANT
POUCH SPECIMEN RETRIEVAL 10MM (ENDOMECHANICALS) ×2 IMPLANT
SET BASIN LINEN APH (SET/KITS/TRAYS/PACK) ×2 IMPLANT
SET TUBE IRRIG SUCTION NO TIP (IRRIGATION / IRRIGATOR) ×1 IMPLANT
STRIP CLOSURE SKIN 1/2X4 (GAUZE/BANDAGES/DRESSINGS) ×2 IMPLANT
SUT MNCRL AB 4-0 PS2 18 (SUTURE) ×3 IMPLANT
SUT VIC AB 2-0 CT2 27 (SUTURE) ×4 IMPLANT
TROCAR Z-THREAD OPTICAL 5X100M (TROCAR) ×1 IMPLANT
WARMER LAPAROSCOPE (MISCELLANEOUS) ×2 IMPLANT

## 2011-06-11 NOTE — Anesthesia Postprocedure Evaluation (Signed)
Immediate Anesthesia Transfer of Care Note  Patient: Traci Molina  Procedure(s) Performed: Procedure(s) (LRB): LAPAROSCOPIC CHOLECYSTECTOMY (N/A)  Patient Location: PACU  Anesthesia Type: General  Level of Consciousness: awake  Airway & Oxygen Therapy: Patient Spontanous Breathing and non-rebreather face mask  Post-op Assessment: Report given to PACU RN, Post -op Vital signs reviewed and stable and Patient moving all extremities  Post vital signs: Reviewed and stable  Complications: No apparent anesthesia complications

## 2011-06-11 NOTE — Op Note (Signed)
Patient:  Traci Molina  DOB:  May 20, 1965  MRN:  [id number]   Preop Diagnosis:  Acute cholecystitis choledocholithiasis  Postop Diagnosis:  The same  Procedure:  Laparoscopic cholecystectomy  Surgeon:  Dr. Tilford Pillar  Anes:  General endotracheal, 0.5% Sensorcaine plain for local anesthesia  Indications:  Patient is a 46 year old female presented to [facility] with epigastric and right upper quadrant abdominal tenderness as well as diffuse jaundice. Workup was consistent for acute cholecystitis and choledocholithiasis. Patient underwent a ERCP with stent placement and tolerated this extremely well. She has since had resolution of her hepatic enzymes as well as reduction in her bilirubins. Plans were made to proceed to the operating room for planned laparoscopic possible open cholecystectomy. Risks benefits alternatives were discussed with the patient. Her questions and concerns were addressed. Patient was consented for planned procedure.  Procedure note:  Patient was taken to the operating room placed in supine position on the or table to the general anesthetic is a Optician, dispensing. Once patient was asleep she was endotracheally intubated by the nurse anesthetist. At this point her abdomen is prepped with DuraPrep solution and draped in standard fashion. A stab incision was created supraumbilically with 11 blade scalpel with additional dissection down to subcuticular tissue carried out using a Coker clamp. The Coker clamp was then utilized to grasp the anterior abdominal fascia and lift his anteriorly. A Veress needle is inserted saline drop test is utilized confirm intraperitoneal placement the pneumoperitoneum was initiated. Once sufficient pneumoperitoneum was obtained an 11 mm was inserted over laparoscopic allowing visualization the trocar entering into the peritoneal cavity. At this point the inner cannulas removed the laparoscope was reinserted there is no evidence a trocar  peritoneal placement injury. At this point a 5 mm car was placed in the epigastrium, a 5 mm trocar was placed in the midline, and a 5 mCi placed in the right lateral abdominal wall. The patient's placed into a reverse Trendelenburg left lateral decubitus position. Several omental incisions are stripped off the body and infundibulum of the gallbladder. The fundus of the gallbladder was grasped lifted up and over the right lobe the liver. Blunt dissection is utilized to strip the peritoneal reflection off the infundibulum. Due to the size of the cystic duct I felt that it was safer to ligate the cystic duct with a Endo GIA 35 standard stapler load. Due to this fact I did exchanged the 5 mm trocar for an 11 mm trocar. The cystic duct was divided with the Endo GIA stapler without difficulty. At this point the cystic artery was identified a window was created behind the cystic artery and the cystic artery was ligated with 2 endoclips across 20 one distally and divided between 2 most distal clips. At this time the gallbladder was dissected off the gallbladder fossa using electrocautery. Once free from the gallbladder fossa was placed into an Endo Catch bag and placed up and over the liver. Hemostasis was obtained in the gallbladder fossa using electrocautery. Due to the raw nature the gallbladder fossa I also opted to place a piece of Surgicel snow into the gallbladder fossa after copiously irrigating the surgical field. Inspection of the endoclips and indicated excellent position with no active bleeding. The divided cystic duct also appeared intact with a Closure and no evidence of bile leak. At this time I turned my attention to closure.  Using Endo Close suture passing device a 2-0 Vicryl sutures passed both the 11 mm per sites. With this suture  in place the gallbladder was retrieved was removed through the umbilical trocar site and intact Endo Catch bag. At this point the gallbladder was placed in the back table  and sent as a permanent specimen to pathology. The pneumoperitoneum was evacuated. Trochars were removed. The Vicryl sutures were secured. Local anesthetic was instilled. A 4-0 Monocryl was utilized reapproximate skin edges at all 4 trocar sites. The skin was washed dried moist dry towel. Half-inch Steri-Strips are placed after placing benzoin around the incisions. The drapes removed the patient was like, general anesthetic was transferred to PACU in stable condition. At the conclusion of procedure all instrument, sponge, needle counts are correct. Patient tolerated procedure extremely well.  Complications:  None apparent  EBL:  Less than 100 mL  Specimen:  Gallbladder

## 2011-06-11 NOTE — Anesthesia Procedure Notes (Signed)
Procedure Name: Intubation Date/Time: 06/11/2011 10:44 AM Performed by: Franco Nones Pre-anesthesia Checklist: Patient identified, Patient being monitored, Timeout performed, Emergency Drugs available and Suction available Patient Re-evaluated:Patient Re-evaluated prior to inductionOxygen Delivery Method: Circle System Utilized Preoxygenation: Pre-oxygenation with 100% oxygen Intubation Type: IV induction Ventilation: Mask ventilation without difficulty Laryngoscope Size: Miller and 2 Grade View: Grade I Tube type: Oral Tube size: 7.0 mm Number of attempts: 1 Airway Equipment and Method: stylet Placement Confirmation: ETT inserted through vocal cords under direct vision,  positive ETCO2 and breath sounds checked- equal and bilateral Secured at: 21 cm Tube secured with: Tape Dental Injury: Teeth and Oropharynx as per pre-operative assessment

## 2011-06-11 NOTE — Progress Notes (Signed)
Patient is going for surgery tomorrow. Patient had no order to obtain consent, so doctor was notified and order given to obtain consent. Also, patient had difficulty sleeping last night, so doctor was made aware and new order given.

## 2011-06-11 NOTE — Transfer of Care (Signed)
Immediate Anesthesia Transfer of Care Note  Patient: Traci Molina  Procedure(s) Performed: Procedure(s) (LRB): LAPAROSCOPIC CHOLECYSTECTOMY (N/A)  Patient Location: PACU  Anesthesia Type: General  Level of Consciousness: awake  Airway & Oxygen Therapy: Patient Spontanous Breathing and nasal cannula  Post-op Assessment: Report given to PACU RN, Post -op Vital signs reviewed and stable and Patient moving all extremities  Post vital signs: Reviewed and stable  Complications: No apparent anesthesia complications

## 2011-06-11 NOTE — Progress Notes (Signed)
Day of Surgery  Subjective: No acute change.  Pain tolerable.  NO nausea   Objective: Vital signs in last 24 hours: Temp:  [98.1 F (36.7 C)-98.9 F (37.2 C)] 98.9 F (37.2 C) (03/22 0939) Pulse Rate:  [66-80] 66  (03/22 0939) Resp:  [13-26] 20  (03/22 1030) BP: (119-140)/(71-90) 139/84 mmHg (03/22 1030) SpO2:  [94 %-100 %] 99 % (03/22 1030) Last BM Date: 06/05/11  Intake/Output from previous day: 03/21 0701 - 03/22 0700 In: 2025 [P.O.:480; I.V.:1495; IV Piggyback:50] Out: 2 [Urine:2] Intake/Output this shift:    General appearance: alert and no distress GI: Soft, flat, mild right upper quadrant abdominal tenderness. No peritoneal signs.  Lab Results:   Basename 06/10/11 0544 06/09/11 0601  WBC 7.9 14.1*  HGB 10.9* 13.1  HCT 33.2* 39.5  PLT 155 212   BMET  Basename 06/10/11 0544 06/09/11 0601  NA 140 136  K 3.8 3.6  CL 103 98  CO2 30 28  GLUCOSE 111* 107*  BUN 11 11  CREATININE 0.68 0.72  CALCIUM 9.3 9.7   PT/INR No results found for this basename: LABPROT:2,INR:2 in the last 72 hours ABG No results found for this basename: PHART:2,PCO2:2,PO2:2,HCO3:2 in the last 72 hours  Studies/Results: Dg Ercp With Sphincterotomy  06/09/2011  *RADIOLOGY REPORT*  Clinical Data: Common bile duct stone.  Cholelithiasis.  ERCP  Comparison:  MRCP  Findings: Multiple C-arm images are provided.  There is cannulation of the common bile duct.  Images show performance of sphincterotomy.  Balloon pull through was performed.  Common duct stent is placed.  IMPRESSION: Sphincterotomy, balloon pull through and stent placement.  Original Report Authenticated By: Thomasenia Sales, M.D.    Anti-infectives: Anti-infectives     Start     Dose/Rate Route Frequency Ordered Stop   06/09/11 1800   ertapenem (INVANZ) 1 g in sodium chloride 0.9 % 50 mL IVPB        1 g 100 mL/hr over 30 Minutes Intravenous Every 24 hours 06/08/11 2029     06/08/11 1915   ertapenem (INVANZ) 1 g in sodium  chloride 0.9 % 50 mL IVPB        1 g 100 mL/hr over 30 Minutes Intravenous  Once 06/08/11 1911 06/08/11 2002          Assessment/Plan: s/p Procedure(s) (LRB): LAPAROSCOPIC CHOLECYSTECTOMY (N/A) Acute cholecystitis choledocholithiasis. Now status post ERCP and stent placement. She has had good resolution of her liver function studies with normalization or near normalization of her liver enzymes and bili rubin levels. At this time risks benefits and alternatives of a laparoscopic possible open cholecystectomy knee were again discussed with the patient. We will plan to proceed as discussed.  LOS: 3 days    Caroline Longie C 06/11/2011

## 2011-06-11 NOTE — Anesthesia Preprocedure Evaluation (Signed)
Anesthesia Evaluation  Patient identified by MRN, date of birth, ID band Patient awake    Reviewed: Allergy & Precautions, H&P , NPO status , Patient's Chart, lab work & pertinent test results  History of Anesthesia Complications Negative for: history of anesthetic complications  Airway Mallampati: II  Neck ROM: Full    Dental  (+) Teeth Intact   Pulmonary neg pulmonary ROS,  breath sounds clear to auscultation        Cardiovascular negative cardio ROS  Rhythm:Regular Rate:Normal     Neuro/Psych    GI/Hepatic negative GI ROS, CBD stones, RUQ pain, n/v   Endo/Other    Renal/GU      Musculoskeletal   Abdominal   Peds  Hematology   Anesthesia Other Findings   Reproductive/Obstetrics                           Anesthesia Physical Anesthesia Plan  ASA: I  Anesthesia Plan: General   Post-op Pain Management:    Induction: Intravenous  Airway Management Planned: Oral ETT  Additional Equipment:   Intra-op Plan:   Post-operative Plan: Extubation in OR  Informed Consent: I have reviewed the patients History and Physical, chart, labs and discussed the procedure including the risks, benefits and alternatives for the proposed anesthesia with the patient or authorized representative who has indicated his/her understanding and acceptance.     Plan Discussed with: CRNA  Anesthesia Plan Comments:         Anesthesia Quick Evaluation

## 2011-06-12 NOTE — Progress Notes (Signed)
Discharge summary: a/o.vss. Saline lock removed. Up ad lib. Incision clean dry and intact.  No complaints of pain. Pt verbalized understanding of instructions. Left floor via wheelchair with nursing staff and family member.

## 2011-06-12 NOTE — Anesthesia Postprocedure Evaluation (Signed)
Anesthesia Post Note  Patient: Traci Molina  Procedure(s) Performed: Procedure(s) (LRB): LAPAROSCOPIC CHOLECYSTECTOMY (N/A)  Anesthesia type: General  Patient location: 321  Post pain: Pain level controlled  Post assessment: Post-op Vital signs reviewed, Patient's Cardiovascular Status Stable, Respiratory Function Stable, Patent Airway, No signs of Nausea or vomiting and Pain level controlled  Last Vitals:  Filed Vitals:   06/12/11 0444  BP: 134/89  Pulse: 73  Temp: 36.9 C  Resp: 18    Post vital signs: Reviewed and stable  Level of consciousness: awake and alert   Complications: No apparent anesthesia complications

## 2011-06-12 NOTE — Addendum Note (Signed)
Addendum  created 06/12/11 1159 by Hannahgrace Lalli S Binyomin Brann, CRNA   Modules edited:Notes Section    

## 2011-06-12 NOTE — Discharge Summary (Signed)
Physician Discharge Summary  Patient ID: Traci Molina MRN: [id number] DOB/AGE: 09/15/1965 46 y.o.  Admit date: 06/08/2011 Discharge date: 06/12/2011  Admission Diagnoses:Acute cholecystitis and choledochal lithiasis   Discharge Diagnoses: The same Active Problems:  * No active hospital problems. *    Discharged Condition: stable  Hospital Course: Patient presented to [facility] ED with over one month of epigastric, RUQ, and substernal abdominal pain.  Patient had been transferred to [facility] for cardiac work-up which was negative.  On work-up in the ED patient had evidence of Gallstones and high suspicion of CBD stone with obstruction.  Patient was admitted for management and continued work-up.  Patient underwent ERCP with stent and after resolution of elevated hepatic enzymes patient was taken to OR for cholecystectomy.  Tolerated well.  Advanced on diet.  Patient ready for discharge.  Consults: GI  Significant Diagnostic Studies: ERCP  Treatments: surgery: ERCP and stent and Laparoscopic cholecystectomy  Discharge Exam: Blood pressure 134/89, pulse 73, temperature 98.4 F (36.9 C), temperature source Oral, resp. rate 18, height 5\' 11"  (1.803 m), weight 85.503 kg (188 lb 8 oz), SpO2 95.00%. General appearance: alert and no distress Resp: clear to auscultation bilaterally Cardio: regular rate and rhythm GI: soft, flat, expected tenderness.  Incision c/d/i.  Disposition: 01-Home or Self Care  Discharge Orders    Future Orders Please Complete By Expires   Diet - low sodium heart healthy      Increase activity slowly      Discharge instructions      Comments:   Increase activity as tolerated. May place ice pack for comfort.  Alternate an anti-inflammatory such as ibuprofen (Motrin, Advil) 400-600mg  every 6 hours with the prescribed pain medication.   Do not take any additional acetaminophen as there is Tylenol in the pain medication.    Driving Restrictions      Comments:   No  driving while on pain medications.    Lifting restrictions      Comments:   No lifting over 20lbs for 4-5 weeks post-op.    Discharge wound care:      Comments:   Clean surgical sites with soap and water.  May shower the morning after surgery unless instructed by Dr. Leticia Penna otherwise.  No soaking for 2-3 weeks.    If adhesive strips are in place, they may be removed in 1-2 weeks while in the shower.    Call MD for:  temperature >100.4      Call MD for:  persistant nausea and vomiting      Call MD for:  redness, tenderness, or signs of infection (pain, swelling, redness, odor or green/yellow discharge around incision site)      Call MD for:  severe uncontrolled pain        Medication List  As of 06/12/2011 12:36 PM   TAKE these medications         ADVIL PM 200-25 MG Caps   Generic drug: Ibuprofen-Diphenhydramine HCl   Take 1 capsule by mouth as needed. For sleep      HYDROcodone-acetaminophen 5-325 MG per tablet   Commonly known as: NORCO   Take 1-2 tablets by mouth every 4 (four) hours as needed for pain.           Follow-up Information    Follow up with Rudi Heap, MD in 4 weeks. (As needed)       Follow up with Kylon Philbrook C, MD in 3 weeks.   Contact information:   [address]  [address] [place] [address] [phone]          Signed: Fabio Bering 06/12/2011, 12:36 PM

## 2011-06-14 ENCOUNTER — Encounter (HOSPITAL_COMMUNITY): Payer: Self-pay | Admitting: Internal Medicine

## 2011-06-14 NOTE — Progress Notes (Signed)
Utilization review completed.  

## 2011-06-15 ENCOUNTER — Encounter (HOSPITAL_COMMUNITY): Payer: Self-pay | Admitting: General Surgery

## 2011-08-10 ENCOUNTER — Ambulatory Visit (INDEPENDENT_AMBULATORY_CARE_PROVIDER_SITE_OTHER): Payer: BC Managed Care – PPO | Admitting: Internal Medicine

## 2011-08-10 ENCOUNTER — Encounter (INDEPENDENT_AMBULATORY_CARE_PROVIDER_SITE_OTHER): Payer: Self-pay | Admitting: Internal Medicine

## 2011-08-10 VITALS — BP 120/74 | HR 70 | Temp 98.2°F | Resp 20 | Ht 71.0 in | Wt 185.1 lb

## 2011-08-10 DIAGNOSIS — D649 Anemia, unspecified: Secondary | ICD-10-CM | POA: Insufficient documentation

## 2011-08-10 DIAGNOSIS — K8050 Calculus of bile duct without cholangitis or cholecystitis without obstruction: Secondary | ICD-10-CM

## 2011-08-10 NOTE — Progress Notes (Signed)
Presenting complaint;  Followup for choledocholithiasis. Subjective:  Patient is 46 year old Caucasian female who underwent ERCP with sphincterotomy and restenting when [facility] that jaundice and markedly elevated transaminases and alkaline phosphatase. She had single stone which could not be removed. Therefore stent was placed. She had laparoscopic cholecystectomy and has done well. She was seen by Dr. Derwood Kaplan and discharged. She did not have any lab studies. She has no complaints. She has good appetite. She denies nausea vomiting abdominal pain or pruritus. She states urine and stool color is back to normal. She wonders if she has passed stent spontaneously since she hasn't experienced any symptoms since discharge.  Current Medications: Current Outpatient Prescriptions  Medication Sig Dispense Refill  . vitamin C (ASCORBIC ACID) 500 MG tablet Take 500 mg by mouth as needed.      Marland Kitchen DISCONTD: diphenhydramine-acetaminophen (TYLENOL PM) 25-500 MG TABS Take 1 tablet by mouth once.           Objective: Blood pressure 120/74, pulse 70, temperature 98.2 F (36.8 C), temperature source Oral, resp. rate 20, height 5\' 11"  (1.803 m), weight 185 lb 1.6 oz (83.961 kg). Conjunctiva is pink. Sclera is nonicteric Oropharyngeal mucosa is normal. No neck masses or thyromegaly noted. Cardiac exam with regular rhythm normal S1 and S2. No murmur or gallop noted. Lungs are clear to auscultation. Abdomen abdomen is symmetrical with well-healed laparoscopy scars. Abdomen is soft and nontender without organomegaly or masses. No LE edema or clubbing noted.  Lab studies; Blood work prior to discharge and reviewed with the patient along with ERCP films.   Assessment:  Patient has single stone in common bile duct which could not be removed on ERCP of 06/09/2011. She is now asymptomatic. She will need to have stent and stone removed.   Plan:  She'll have preop KUB along with CBC  and comprehensive chemistry panel. Presuming stent is in place she will be scheduled for ERCP for stent and stone removal.

## 2011-08-10 NOTE — Patient Instructions (Signed)
ERCP to be scheduled 

## 2011-08-11 ENCOUNTER — Other Ambulatory Visit (INDEPENDENT_AMBULATORY_CARE_PROVIDER_SITE_OTHER): Payer: Self-pay | Admitting: *Deleted

## 2011-08-11 DIAGNOSIS — K8050 Calculus of bile duct without cholangitis or cholecystitis without obstruction: Secondary | ICD-10-CM

## 2011-08-23 ENCOUNTER — Other Ambulatory Visit (HOSPITAL_COMMUNITY): Payer: BC Managed Care – PPO

## 2011-08-24 ENCOUNTER — Encounter (HOSPITAL_COMMUNITY): Payer: Self-pay | Admitting: Pharmacy Technician

## 2011-08-25 ENCOUNTER — Encounter (HOSPITAL_COMMUNITY)
Admission: RE | Admit: 2011-08-25 | Discharge: 2011-08-25 | Disposition: A | Discharge status: Home / Self Care | Payer: BC Managed Care – PPO | Source: Ambulatory Visit | Attending: Internal Medicine | Admitting: Internal Medicine

## 2011-08-25 ENCOUNTER — Telehealth (INDEPENDENT_AMBULATORY_CARE_PROVIDER_SITE_OTHER): Payer: Self-pay | Admitting: Internal Medicine

## 2011-08-25 ENCOUNTER — Encounter (HOSPITAL_COMMUNITY): Payer: Self-pay

## 2011-08-25 VITALS — BP 114/72 | HR 77 | Temp 98.5°F | Resp 16 | Ht 71.0 in | Wt 185.0 lb

## 2011-08-25 DIAGNOSIS — K8050 Calculus of bile duct without cholangitis or cholecystitis without obstruction: Secondary | ICD-10-CM

## 2011-08-25 LAB — CBC
MCH: 28 pg (ref 26.0–34.0)
MCV: 84.9 fL (ref 78.0–100.0)
Platelets: 199 10*3/uL (ref 150–400)
RDW: 13.8 % (ref 11.5–15.5)
WBC: 7.6 10*3/uL (ref 4.0–10.5)

## 2011-08-25 LAB — COMPREHENSIVE METABOLIC PANEL
ALT: 70 U/L — ABNORMAL HIGH (ref 0–35)
AST: 46 U/L — ABNORMAL HIGH (ref 0–37)
Alkaline Phosphatase: 84 U/L (ref 39–117)
Calcium: 9.9 mg/dL (ref 8.4–10.5)
Potassium: 4.5 mEq/L (ref 3.5–5.1)
Sodium: 141 mEq/L (ref 135–145)
Total Protein: 6.8 g/dL (ref 6.0–8.3)

## 2011-08-25 LAB — DIFFERENTIAL
Basophils Absolute: 0 10*3/uL (ref 0.0–0.1)
Eosinophils Absolute: 0.2 10*3/uL (ref 0.0–0.7)
Eosinophils Relative: 2 % (ref 0–5)
Lymphocytes Relative: 21 % (ref 12–46)
Neutrophils Relative %: 66 % (ref 43–77)

## 2011-08-25 NOTE — Patient Instructions (Signed)
20 Traci Molina  08/25/2011   Your procedure is scheduled on: Monday, 08/30/11  [facility] to [facility] [place] [facility].  Call this number if you have problems the morning of surgery: [phone]   Remember:   Do not eat food:After Midnight.  May have clear liquids:until Midnight .  Clear liquids include soda, tea, black coffee, apple or grape juice, broth.  Take these medicines the morning of surgery with A SIP OF WATER: none   Do not wear jewelry, make-up or nail polish.  Do not wear lotions, powders, or perfumes. You may wear deodorant.  Do not shave 48 hours prior to surgery. Men may shave face and neck.  Do not bring valuables to the hospital.  Contacts, dentures or bridgework may not be worn into surgery.  Leave suitcase in the car. After surgery it may be brought to your room.  For patients admitted to the hospital, checkout time is 11:00 AM the day of discharge.   Patients discharged the day of surgery will not be allowed to drive home.  Name and phone number of your driver: family  Special Instructions: N/A   Please read over the following fact sheets that you were given: Pain Booklet, MRSA Information, Surgical Site Infection Prevention, Anesthesia Post-op Instructions and Care and Recovery After Surgery   Endoscopic Retrograde Cholangiopancreatography This is a test used to evaluate people with jaundice (a condition in which the person's skin is turning yellow because of the high level of bilirubin in your blood). Bilirubin is a product of the blood which is elevated when an obstruction in the bile duct occurs. The bile ducts are the pipe-like system which carry the bile from the liver to the gallbladder and out into your small bowel. In this test, a fiber optic endoscope (a small pencil-sized telescope) is inserted and a catheter is put into ducts for examination. This test can reveal stones, strictures, cysts, tumors, and other irregularities within the pancreatic ducts and bile  ducts. PREPARATION FOR TEST Do not eat or drink after midnight the day before the test.  NORMAL FINDINGS Normal size of biliary and pancreatic ducts. No obstruction or filling defects within the biliary or pancreatic ducts. Ranges for normal findings may vary among different laboratories and hospitals. You should always check with your doctor after having lab work or other tests done to discuss the meaning of your test results and whether your values are considered within normal limits. MEANING OF TEST  Your caregiver will go over the test results with you and discuss the importance and meaning of your results, as well as treatment options and the need for additional tests if necessary. OBTAINING THE TEST RESULTS  It is your responsibility to obtain your test results. Ask the lab or department performing the test when and how you will get your results. Document Released: 07/02/2004 Document Revised: 11/18/2010 Document Reviewed: 02/16/2008 [facility] Patient Information 2012 [place], [place].

## 2011-08-27 NOTE — Telephone Encounter (Signed)
No message attached to this call.

## 2011-08-30 ENCOUNTER — Ambulatory Visit (HOSPITAL_COMMUNITY): Payer: BC Managed Care – PPO

## 2011-08-30 ENCOUNTER — Ambulatory Visit (HOSPITAL_COMMUNITY): Payer: BC Managed Care – PPO | Admitting: Anesthesiology

## 2011-08-30 ENCOUNTER — Ambulatory Visit (HOSPITAL_COMMUNITY)
Admission: RE | Admit: 2011-08-30 | Discharge: 2011-08-30 | Disposition: A | Discharge status: Home / Self Care | Payer: BC Managed Care – PPO | Source: Ambulatory Visit | Attending: Internal Medicine | Admitting: Internal Medicine

## 2011-08-30 ENCOUNTER — Encounter (HOSPITAL_COMMUNITY)
Admission: RE | Disposition: A | Discharge status: Home / Self Care | Payer: Self-pay | Source: Ambulatory Visit | Attending: Internal Medicine

## 2011-08-30 ENCOUNTER — Encounter (HOSPITAL_COMMUNITY): Payer: Self-pay | Admitting: *Deleted

## 2011-08-30 ENCOUNTER — Encounter (HOSPITAL_COMMUNITY): Payer: Self-pay | Admitting: Anesthesiology

## 2011-08-30 DIAGNOSIS — K8050 Calculus of bile duct without cholangitis or cholecystitis without obstruction: Secondary | ICD-10-CM | POA: Insufficient documentation

## 2011-08-30 DIAGNOSIS — K831 Obstruction of bile duct: Secondary | ICD-10-CM

## 2011-08-30 DIAGNOSIS — Z4682 Encounter for fitting and adjustment of non-vascular catheter: Secondary | ICD-10-CM | POA: Insufficient documentation

## 2011-08-30 DIAGNOSIS — Z01812 Encounter for preprocedural laboratory examination: Secondary | ICD-10-CM | POA: Insufficient documentation

## 2011-08-30 HISTORY — PX: REMOVAL OF STONES: SHX5545

## 2011-08-30 HISTORY — PX: ERCP: SHX5425

## 2011-08-30 SURGERY — ERCP, WITH INTERVENTION IF INDICATED
Anesthesia: General

## 2011-08-30 MED ORDER — GLYCOPYRROLATE 0.2 MG/ML IJ SOLN
INTRAMUSCULAR | Status: AC
  Administered 2011-08-30: 0.2 mg via INTRAVENOUS
  Filled 2011-08-30: qty 1

## 2011-08-30 MED ORDER — GLYCOPYRROLATE 0.2 MG/ML IJ SOLN
INTRAMUSCULAR | Status: AC
  Filled 2011-08-30: qty 3

## 2011-08-30 MED ORDER — LACTATED RINGERS IV SOLN
INTRAVENOUS | Status: DC | PRN
  Administered 2011-08-30: 08:00:00 via INTRAVENOUS

## 2011-08-30 MED ORDER — ONDANSETRON HCL 4 MG/2ML IJ SOLN
INTRAMUSCULAR | Status: DC | PRN
  Administered 2011-08-30: 4 mg via INTRAVENOUS

## 2011-08-30 MED ORDER — STERILE WATER FOR IRRIGATION IR SOLN
Status: DC | PRN
  Administered 2011-08-30: 1000 mL

## 2011-08-30 MED ORDER — GLYCOPYRROLATE 0.2 MG/ML IJ SOLN
INTRAMUSCULAR | Status: DC | PRN
  Administered 2011-08-30: .6 mg via INTRAVENOUS

## 2011-08-30 MED ORDER — CEFAZOLIN SODIUM 1-5 GM-% IV SOLN
INTRAVENOUS | Status: AC
  Administered 2011-08-30: 1 g via INTRAVENOUS
  Filled 2011-08-30: qty 50

## 2011-08-30 MED ORDER — CIPROFLOXACIN 400 MG/40ML IV SOLN
400.0000 mg | Freq: Once | INTRAVENOUS | Status: DC
  Filled 2011-08-30: qty 40

## 2011-08-30 MED ORDER — PROPOFOL 10 MG/ML IV EMUL
INTRAVENOUS | Status: DC | PRN
  Administered 2011-08-30: 200 mg via INTRAVENOUS

## 2011-08-30 MED ORDER — GLUCAGON HCL (RDNA) 1 MG IJ SOLR
INTRAMUSCULAR | Status: DC | PRN
  Administered 2011-08-30: 0.25 mg via INTRAVENOUS

## 2011-08-30 MED ORDER — FENTANYL CITRATE 0.05 MG/ML IJ SOLN
25.0000 ug | INTRAMUSCULAR | Status: DC | PRN
Start: 2011-08-30 — End: 2011-08-30

## 2011-08-30 MED ORDER — FENTANYL CITRATE 0.05 MG/ML IJ SOLN
INTRAMUSCULAR | Status: DC | PRN
  Administered 2011-08-30: 50 ug via INTRAVENOUS

## 2011-08-30 MED ORDER — GLYCOPYRROLATE 0.2 MG/ML IJ SOLN
0.2000 mg | Freq: Once | INTRAMUSCULAR | Status: AC
  Administered 2011-08-30: 0.2 mg via INTRAVENOUS

## 2011-08-30 MED ORDER — STERILE WATER FOR IRRIGATION IR SOLN
Status: DC | PRN
  Administered 2011-08-30: 07:00:00

## 2011-08-30 MED ORDER — ONDANSETRON HCL 4 MG/2ML IJ SOLN
4.0000 mg | Freq: Once | INTRAMUSCULAR | Status: AC
  Administered 2011-08-30: 4 mg via INTRAVENOUS

## 2011-08-30 MED ORDER — ONDANSETRON HCL 4 MG/2ML IJ SOLN: 4.0000 mg | Freq: Once | INTRAMUSCULAR | Status: DC | PRN

## 2011-08-30 MED ORDER — SODIUM CHLORIDE 0.9 % IV SOLN
INTRAVENOUS | Status: DC | PRN
  Administered 2011-08-30: 07:00:00

## 2011-08-30 MED ORDER — LACTATED RINGERS IV SOLN
INTRAVENOUS | Status: DC
  Administered 2011-08-30: 07:00:00 via INTRAVENOUS

## 2011-08-30 MED ORDER — CEFAZOLIN SODIUM 1-5 GM-% IV SOLN: 1.0000 g | INTRAVENOUS | Status: DC

## 2011-08-30 MED ORDER — LIDOCAINE HCL (CARDIAC) 10 MG/ML IV SOLN
INTRAVENOUS | Status: DC | PRN
  Administered 2011-08-30: 30 mg via INTRAVENOUS

## 2011-08-30 MED ORDER — PROPOFOL 10 MG/ML IV EMUL
INTRAVENOUS | Status: AC
  Filled 2011-08-30: qty 20

## 2011-08-30 MED ORDER — FENTANYL CITRATE 0.05 MG/ML IJ SOLN
INTRAMUSCULAR | Status: AC
  Filled 2011-08-30: qty 2

## 2011-08-30 MED ORDER — NEOSTIGMINE METHYLSULFATE 1 MG/ML IJ SOLN
INTRAMUSCULAR | Status: AC
  Filled 2011-08-30: qty 10

## 2011-08-30 MED ORDER — LIDOCAINE HCL (PF) 1 % IJ SOLN
INTRAMUSCULAR | Status: AC
  Filled 2011-08-30: qty 5

## 2011-08-30 MED ORDER — MIDAZOLAM HCL 2 MG/2ML IJ SOLN
INTRAMUSCULAR | Status: AC
  Administered 2011-08-30: 2 mg via INTRAVENOUS
  Filled 2011-08-30: qty 2

## 2011-08-30 MED ORDER — CIPROFLOXACIN IN D5W 400 MG/200ML IV SOLN
INTRAVENOUS | Status: AC
  Administered 2011-08-30: 400 mg via INTRAVENOUS
  Filled 2011-08-30: qty 200

## 2011-08-30 MED ORDER — ONDANSETRON HCL 4 MG/2ML IJ SOLN
INTRAMUSCULAR | Status: AC
  Administered 2011-08-30: 4 mg via INTRAVENOUS
  Filled 2011-08-30: qty 2

## 2011-08-30 MED ORDER — MIDAZOLAM HCL 2 MG/2ML IJ SOLN
1.0000 mg | INTRAMUSCULAR | Status: DC | PRN
  Administered 2011-08-30: 2 mg via INTRAVENOUS

## 2011-08-30 MED ORDER — NEOSTIGMINE METHYLSULFATE 1 MG/ML IJ SOLN
INTRAMUSCULAR | Status: DC | PRN
  Administered 2011-08-30: 4 mg via INTRAVENOUS

## 2011-08-30 MED ORDER — ROCURONIUM BROMIDE 100 MG/10ML IV SOLN
INTRAVENOUS | Status: DC | PRN
  Administered 2011-08-30: 50 mg via INTRAVENOUS

## 2011-08-30 MED ORDER — ROCURONIUM BROMIDE 50 MG/5ML IV SOLN
INTRAVENOUS | Status: AC
  Filled 2011-08-30: qty 1

## 2011-08-30 SURGICAL SUPPLY — 26 items
BAG HAMPER (MISCELLANEOUS) ×2 IMPLANT
BALLN RETRIEVAL 12X15 (BALLOONS) ×1 IMPLANT
BALN RTRVL 200 6-7FR 12-15 (BALLOONS) ×1
BASKET TRAPEZOID 3X6 (MISCELLANEOUS) IMPLANT
BSKT STON RTRVL TRAPEZOID 3X6 (MISCELLANEOUS)
DEVICE INFLATION ENCORE 26 (MISCELLANEOUS) IMPLANT
DEVICE LOCKING W-BIOPSY CAP (MISCELLANEOUS) ×2 IMPLANT
GUIDEWIRE HYDRA JAGWIRE .35 (WIRE) IMPLANT
GUIDEWIRE JAG HINI 025X260CM (WIRE) IMPLANT
KIT ROOM TURNOVER APOR (KITS) ×2 IMPLANT
LUBRICANT JELLY 4.5OZ STERILE (MISCELLANEOUS) ×1 IMPLANT
NDL HYPO 18GX1.5 BLUNT FILL (NEEDLE) IMPLANT
NEEDLE HYPO 18GX1.5 BLUNT FILL (NEEDLE) ×2 IMPLANT
PAD ARMBOARD 7.5X6 YLW CONV (MISCELLANEOUS) ×2 IMPLANT
PATHFINDER 450CM 0.18 (STENTS) IMPLANT
POSITIONER HEAD 8X9X4 ADT (SOFTGOODS) ×1 IMPLANT
SNARE ROTATE MED OVAL 20MM (MISCELLANEOUS) IMPLANT
SPHINCTEROTOME AUTOTOME .25 (MISCELLANEOUS) IMPLANT
SPHINCTEROTOME HYDRATOME 44 (MISCELLANEOUS) ×1 IMPLANT
SPONGE GAUZE 4X4 12PLY (GAUZE/BANDAGES/DRESSINGS) ×2 IMPLANT
SYR 3ML LL SCALE MARK (SYRINGE) ×1 IMPLANT
SYR 50ML LL SCALE MARK (SYRINGE) ×1 IMPLANT
SYSTEM CONTINUOUS INJECTION (MISCELLANEOUS) ×2 IMPLANT
WALLSTENT METAL COVERED 10X60 (STENTS) IMPLANT
WALLSTENT METAL COVERED 10X80 (STENTS) IMPLANT
WATER STERILE IRR 1000ML POUR (IV SOLUTION) ×3 IMPLANT

## 2011-08-30 NOTE — H&P (Signed)
Traci Molina is an 46 y.o. female.   Chief Complaint: Patient is here for ERCP with removal of stent and stone. HPI: Patient is 46 year old Caucasian female who presented over 6 weeks ago with jaundice. She underwent ERCP on 06/09/2011 revealing single stone could which could not be removed. She results noted to have a very low takeoff of cystic duct. She has done well 2 she had laparoscopic cholecystectomy on 06/11/2011. She feels fine. She has good appetite. She denies abdominal pain nausea vomiting fever or chills. She had lab studies on 08/25/2011. CBC is now normal. Her bilirubin is 1.1 alkaline phosphatase 84. AST 46 and ALT 70.  Past Medical History  Diagnosis Date  . No pertinent past medical history     Past Surgical History  Procedure Date  . Appendectomy   . Abdominal hysterectomy   . Ercp 06/09/2011    Procedure: ENDOSCOPIC RETROGRADE CHOLANGIOPANCREATOGRAPHY (ERCP);  Surgeon: Malissa Hippo, MD;  Location: [facility];  Service: Endoscopy;  Laterality: N/A;  scope out time 1424  . Sphincterotomy 06/09/2011    Procedure: SPHINCTEROTOMY;  Surgeon: Malissa Hippo, MD;  Location: [facility];  Service: Endoscopy;;  . Biliary stent placement 06/09/2011    Procedure: BILIARY STENT PLACEMENT;  Surgeon: Malissa Hippo, MD;  Location: [facility];  Service: Endoscopy;;  . Cholecystectomy 06/11/2011    Procedure: LAPAROSCOPIC CHOLECYSTECTOMY;  Surgeon: Fabio Bering, MD;  Location: [facility];  Service: General;  Laterality: N/A;    Family History  Problem Relation Age of Onset  . Healthy Father   . Healthy Brother   . Healthy Son   . Healthy Daughter    Social History:  reports that she has never smoked. She has never used smokeless tobacco. She reports that she does not drink alcohol or use illicit drugs.  Allergies: No Known Allergies  No prescriptions prior to admission    No results found for this or any previous visit (from the past 48 hour(s)). No results found.  ROS  Blood  pressure 119/81, pulse 60, temperature 97.9 F (36.6 C), temperature source Oral, resp. rate 21, SpO2 98.00%. Physical Exam  Constitutional: She appears well-developed and well-nourished.  HENT:  Mouth/Throat: Oropharynx is clear and moist.  Eyes: Conjunctivae are normal.  Neck: No thyromegaly present.  Cardiovascular: Normal rate, regular rhythm and normal heart sounds.   No murmur heard. Respiratory: Effort normal and breath sounds normal.  GI: Soft. She exhibits no distension and no mass. There is no tenderness.  Musculoskeletal: She exhibits no edema.  Lymphadenopathy:    She has no cervical adenopathy.  Neurological: She is alert.  Skin: Skin is warm and dry.     Assessment/Plan Choledocholithiasis. Patient also has biliary stent which was placed on 06/09/2011. Transaminases are mildly abnormal but significantly improved prior to ERCP and her AST was 689 and ALT of 1278. ERCP with removal of the biliary stent and stone.  Traci Molina U 08/30/2011, 7:15 AM

## 2011-08-30 NOTE — Anesthesia Postprocedure Evaluation (Signed)
  Anesthesia Post-op Note  Patient: Traci Molina  Procedure(s) Performed: Procedure(s) (LRB): ENDOSCOPIC RETROGRADE CHOLANGIOPANCREATOGRAPHY (ERCP) (N/A) REMOVAL OF STONES (N/A)  Patient Location: PACU  Anesthesia Type: General  Level of Consciousness: awake, alert  and oriented  Airway and Oxygen Therapy: Patient Spontanous Breathing and Patient connected to nasal cannula oxygen  Post-op Pain: none  Post-op Assessment: Post-op Vital signs reviewed, Patient's Cardiovascular Status Stable, Respiratory Function Stable, Patent Airway, No signs of Nausea or vomiting and Pain level controlled  Post-op Vital Signs: Reviewed and stable  Complications: No apparent anesthesia complications

## 2011-08-30 NOTE — Discharge Instructions (Signed)
Clear liquids today. Resume usual diet starting tomorrow morning. No driving for 24 hours. Repeat LFTs in 3 weeks(office will call)  PATIENT INSTRUCTIONS POST-ANESTHESIA  IMMEDIATELY FOLLOWING SURGERY:  Do not drive or operate machinery for the first twenty four hours after surgery.  Do not make any important decisions for twenty four hours after surgery or while taking narcotic pain medications or sedatives.  If you develop intractable nausea and vomiting or a severe headache please notify your doctor immediately.  FOLLOW-UP:  Please make an appointment with your surgeon as instructed. You do not need to follow up with anesthesia unless specifically instructed to do so.  WOUND CARE INSTRUCTIONS (if applicable):  Keep a dry clean dressing on the anesthesia/puncture wound site if there is drainage.  Once the wound has quit draining you may leave it open to air.  Generally you should leave the bandage intact for twenty four hours unless there is drainage.  If the epidural site drains for more than 36-48 hours please call the anesthesia department.  QUESTIONS?:  Please feel free to call your physician or the hospital operator if you have any questions, and they will be happy to assist you.

## 2011-08-30 NOTE — Transfer of Care (Signed)
Immediate Anesthesia Transfer of Care Note  Patient: Traci Molina  Procedure(s) Performed: Procedure(s) (LRB): ENDOSCOPIC RETROGRADE CHOLANGIOPANCREATOGRAPHY (ERCP) (N/A) REMOVAL OF STONES (N/A)  Patient Location: PACU  Anesthesia Type: General  Level of Consciousness: awake, alert  and oriented  Airway & Oxygen Therapy: Patient Spontanous Breathing and Patient connected to nasal cannula oxygen  Post-op Assessment: Report given to PACU RN, Post -op Vital signs reviewed and stable and Patient moving all extremities X 4  Post vital signs: Reviewed and stable  Complications: No apparent anesthesia complications

## 2011-08-30 NOTE — Anesthesia Preprocedure Evaluation (Signed)
Anesthesia Evaluation  Patient identified by MRN, date of birth, ID band Patient awake    Reviewed: Allergy & Precautions, H&P , NPO status , Patient's Chart, lab work & pertinent test results  History of Anesthesia Complications Negative for: history of anesthetic complications  Airway Mallampati: II  Neck ROM: Full    Dental  (+) Teeth Intact   Pulmonary neg pulmonary ROS,  breath sounds clear to auscultation        Cardiovascular negative cardio ROS  Rhythm:Regular Rate:Normal     Neuro/Psych    GI/Hepatic negative GI ROS, CBD stones, RUQ pain, n/v   Endo/Other    Renal/GU      Musculoskeletal   Abdominal   Peds  Hematology   Anesthesia Other Findings   Reproductive/Obstetrics                           Anesthesia Physical Anesthesia Plan  ASA: I  Anesthesia Plan: General   Post-op Pain Management:    Induction: Intravenous  Airway Management Planned: Oral ETT  Additional Equipment:   Intra-op Plan:   Post-operative Plan: Extubation in OR  Informed Consent: I have reviewed the patients History and Physical, chart, labs and discussed the procedure including the risks, benefits and alternatives for the proposed anesthesia with the patient or authorized representative who has indicated his/her understanding and acceptance.     Plan Discussed with:   Anesthesia Plan Comments:         Anesthesia Quick Evaluation

## 2011-08-30 NOTE — Op Note (Signed)
[name], Traci Molina             ACCOUNT NO.:  [id number]  MEDICAL RECORD NO.:  [id number]  LOCATION:  [facility]                          FACILITY:  [facility]  PHYSICIAN:  Lionel December, M.D.    DATE OF BIRTH:  19-Apr-1965  DATE OF PROCEDURE: DATE OF DISCHARGE:  08/30/2011                              OPERATIVE REPORT   SURGEON:  Lionel December, MD  PROCEDURE:  ERCP with stent and stone removal.  INDICATION:  The patient is 46 year old Caucasian female who presented 7 weeks ago with obstructive jaundice.  She had markedly elevated transaminases with AST of close to 700 and ALT close to 1300.  ERCP revealed single stone and very low cystic duct takeoff.  Stone could not be removed.  Therefore, stent was left in place.  She has done following cholecystectomy on June 11, 2011.  She is not returning for removal of stent and stone.  She is asymptomatic.  Her AST and ALT last week are 46 and 70.  Meds for conscious sedation.  Please see anesthesia records for details.  FINDINGS:  Procedure performed in the OR.  Once the patient was placed under anesthesia and intubated, she was turned into semiprone position. Time-out was performed.  Therapeutic video duodenoscope was passed via oropharynx without any difficulty into the esophagus and stomach and across the pylorus into second part of the duodenum.  Biliary stent was caught with snare and removed it under fluoroscopic control.  Stent was examined and had lot of yellow debris and in side-port and was nearly completely occluded.  Duodenoscope was passed again.  CBD was cannulated with RX 44 Autotome and 0.035 Hydra Jagwire.  CBD was 7-8 mm without any definite filling defects.  Very low cystic duct remnant takeoff on the medial side.  Intrahepatic biliary radicals were normal.  Stone balloon extractor was then passed in the duct.  Occlusion cholangiogram was obtained and stone was passed to the duct few times with removal of black debris, which  was felt to be stone fragments.  There was excellent drainage.  Endoscope was withdrawn.  The patient tolerated the procedure well and she is in the process of being extubated.  FINAL DIAGNOSIS:  Biliary stent, removed.  Few stone fragments were removed from the bile duct using stone balloon extractor.  Pancreatic duct was not filled or studied.  RECOMMENDATIONS:  Clear liquids today.  She will resume her usual diet starting in a.m.  We will check her LFTs in 3 weeks.          ______________________________ Lionel December, M.D.     NR/MEDQ  D:  08/30/2011  T:  08/30/2011  Job:  [id number]  cc:   Tilford Pillar, MD Fax: [phone]

## 2011-08-30 NOTE — Preoperative (Signed)
Beta Blockers   Reason not to administer Beta Blockers:Not Applicable 

## 2011-08-30 NOTE — Brief Op Note (Signed)
08/30/2011  8:26 AM  PATIENT:  Traci Molina  46 y.o. female  PRE-OPERATIVE DIAGNOSIS:  choledocholithiasis  POST-OPERATIVE DIAGNOSIS:  choledocholithiasis, common duct stones  PROCEDURE:  Procedure(s) (LRB): ENDOSCOPIC RETROGRADE CHOLANGIOPANCREATOGRAPHY (ERCP) (N/A) REMOVAL OF STONES (N/A)  SURGEON:  Surgeon(s) and Role:    * Malissa Hippo, MD - Primary  Findings; Biliary stent removed under fluoroscopic control. It was nearly completely occluded with yellow debris. Cholangiogram obtained. CBD 7-8 mm in diameter. Stone balloon extractor passed through the duct few times with removal of black fragments. Excellent drainage. Patient tolerated the procedure well.

## 2011-08-30 NOTE — Anesthesia Procedure Notes (Signed)
Procedure Name: Intubation Date/Time: 08/30/2011 7:50 AM Performed by: Quentin Ore Pre-anesthesia Checklist: Patient identified, Emergency Drugs available, Suction available, Patient being monitored and Timeout performed Patient Re-evaluated:Patient Re-evaluated prior to inductionOxygen Delivery Method: Circle system utilized Preoxygenation: Pre-oxygenation with 100% oxygen Intubation Type: IV induction Ventilation: Mask ventilation without difficulty Laryngoscope Size: Mac and 3 Grade View: Grade II Tube type: Oral Tube size: 7.0 mm Number of attempts: 2 Airway Equipment and Method: Stylet Placement Confirmation: ETT inserted through vocal cords under direct vision,  positive ETCO2 and breath sounds checked- equal and bilateral Secured at: 20 cm Tube secured with: Tape Dental Injury: Teeth and Oropharynx as per pre-operative assessment

## 2011-08-31 ENCOUNTER — Telehealth (INDEPENDENT_AMBULATORY_CARE_PROVIDER_SITE_OTHER): Payer: Self-pay | Admitting: *Deleted

## 2011-08-31 ENCOUNTER — Encounter (HOSPITAL_COMMUNITY): Payer: Self-pay | Admitting: Internal Medicine

## 2011-08-31 DIAGNOSIS — R7401 Elevation of levels of liver transaminase levels: Secondary | ICD-10-CM

## 2011-08-31 NOTE — Telephone Encounter (Signed)
Per NUR lab in 3 weeks

## 2014-09-07 ENCOUNTER — Ambulatory Visit (INDEPENDENT_AMBULATORY_CARE_PROVIDER_SITE_OTHER): Payer: 59 | Admitting: Family

## 2014-09-07 ENCOUNTER — Encounter: Payer: Self-pay | Admitting: Family

## 2014-09-07 VITALS — BP 135/82 | HR 70 | Temp 98.0°F | Ht 71.0 in | Wt 164.0 lb

## 2014-09-07 DIAGNOSIS — J0100 Acute maxillary sinusitis, unspecified: Secondary | ICD-10-CM | POA: Diagnosis not present

## 2014-09-07 MED ORDER — AMOXICILLIN-POT CLAVULANATE 875-125 MG PO TABS
1.0000 | ORAL_TABLET | Freq: Two times a day (BID) | ORAL | Status: DC
Start: 2014-09-07 — End: 2015-02-21

## 2014-09-07 NOTE — Progress Notes (Signed)
   Subjective:    Patient ID: Traci Molina, female    DOB: 1965/09/06, 49 y.o.   MRN: [id number]  Otalgia  There is pain in the left ear. This is a new problem. The current episode started yesterday. The problem occurs every few minutes. The problem has been resolved. There has been no fever. The pain is at a severity of 3/10. The pain is mild. Associated symptoms include a sore throat. Pertinent negatives include no coughing, diarrhea, ear discharge, headaches, hearing loss, neck pain or rhinorrhea. She has tried nothing for the symptoms. The treatment provided mild relief.      Review of Systems  Constitutional: Negative.   HENT: Positive for ear pain and sore throat. Negative for ear discharge, hearing loss and rhinorrhea.   Eyes: Negative.   Respiratory: Negative.  Negative for cough and shortness of breath.   Cardiovascular: Negative.  Negative for palpitations.  Gastrointestinal: Negative.  Negative for diarrhea.  Endocrine: Negative.   Genitourinary: Negative.   Musculoskeletal: Negative.  Negative for neck pain.  Neurological: Negative.  Negative for headaches.  Hematological: Negative.   Psychiatric/Behavioral: Negative.   All other systems reviewed and are negative.      Objective:   Physical Exam  Constitutional: She is oriented to person, place, and time. She appears well-developed and well-nourished. No distress.  HENT:  Head: Normocephalic and atraumatic.  Right Ear: External ear normal.  Nose: Right sinus exhibits maxillary sinus tenderness. Left sinus exhibits maxillary sinus tenderness.  Mouth/Throat: Oropharynx is clear and moist.  Oropharynx erythemas   Eyes: Pupils are equal, round, and reactive to light.  Neck: Normal range of motion. Neck supple. No thyromegaly present.  Cardiovascular: Normal rate, regular rhythm, normal heart sounds and intact distal pulses.   No murmur heard. Pulmonary/Chest: Effort normal and breath sounds normal. No respiratory  distress. She has no wheezes.  Abdominal: Soft. Bowel sounds are normal. She exhibits no distension. There is no tenderness.  Musculoskeletal: Normal range of motion. She exhibits no edema or tenderness.  Neurological: She is alert and oriented to person, place, and time. She has normal reflexes. No cranial nerve deficit.  Skin: Skin is warm and dry.  Psychiatric: She has a normal mood and affect. Her behavior is normal. Judgment and thought content normal.  Vitals reviewed.    BP 135/82 mmHg  Pulse 70  Temp(Src) 98 F (36.7 C) (Oral)  Ht 5\' 11"  (1.803 m)  Wt 164 lb (74.39 kg)  BMI 22.88 kg/m2      Assessment & Plan:  1. Acute maxillary sinusitis, recurrence not specified -- Take meds as prescribed - Use a cool mist humidifier  -Use saline nose sprays frequently -Saline irrigations of the nose can be very helpful if done frequently.  * 4X daily for 1 week*  * Use of a nettie pot can be helpful with this. Follow directions with this* -Force fluids -For any cough or congestion  Use plain Mucinex- regular strength or max strength is fine   * Children- consult with Pharmacist for dosing -For fever or aces or pains- take tylenol or ibuprofen appropriate for age and weight.  * for fevers greater than 101 orally you may alternate ibuprofen and tylenol every  3 hours. -Throat lozenges if help - amoxicillin-clavulanate (AUGMENTIN) 875-125 MG per tablet; Take 1 tablet by mouth 2 (two) times daily.  Dispense: 14 tablet; Refill: 0  Evelina Dun, FNP

## 2014-09-07 NOTE — Patient Instructions (Addendum)
Sinusitis Sinusitis is redness, soreness, and inflammation of the paranasal sinuses. Paranasal sinuses are air pockets within the bones of your face (beneath the eyes, the middle of the forehead, or above the eyes). In healthy paranasal sinuses, mucus is able to drain out, and air is able to circulate through them by way of your nose. However, when your paranasal sinuses are inflamed, mucus and air can become trapped. This can allow bacteria and other germs to grow and cause infection. Sinusitis can develop quickly and last only a short time (acute) or continue over a long period (chronic). Sinusitis that lasts for more than 12 weeks is considered chronic.  CAUSES  Causes of sinusitis include:  Allergies.  Structural abnormalities, such as displacement of the cartilage that separates your nostrils (deviated septum), which can decrease the air flow through your nose and sinuses and affect sinus drainage.  Functional abnormalities, such as when the small hairs (cilia) that line your sinuses and help remove mucus do not work properly or are not present. SIGNS AND SYMPTOMS  Symptoms of acute and chronic sinusitis are the same. The primary symptoms are pain and pressure around the affected sinuses. Other symptoms include:  Upper toothache.  Earache.  Headache.  Bad breath.  Decreased sense of smell and taste.  A cough, which worsens when you are lying flat.  Fatigue.  Fever.  Thick drainage from your nose, which often is green and may contain pus (purulent).  Swelling and warmth over the affected sinuses. DIAGNOSIS  Your health care provider will perform a physical exam. During the exam, your health care provider may:  Look in your nose for signs of abnormal growths in your nostrils (nasal polyps).  Tap over the affected sinus to check for signs of infection.  View the inside of your sinuses (endoscopy) using an imaging device that has a light attached (endoscope). If your health  care provider suspects that you have chronic sinusitis, one or more of the following tests may be recommended:  Allergy tests.  Nasal culture. A sample of mucus is taken from your nose, sent to a lab, and screened for bacteria.  Nasal cytology. A sample of mucus is taken from your nose and examined by your health care provider to determine if your sinusitis is related to an allergy. TREATMENT  Most cases of acute sinusitis are related to a viral infection and will resolve on their own within 10 days. Sometimes medicines are prescribed to help relieve symptoms (pain medicine, decongestants, nasal steroid sprays, or saline sprays).  However, for sinusitis related to a bacterial infection, your health care provider will prescribe antibiotic medicines. These are medicines that will help kill the bacteria causing the infection.  Rarely, sinusitis is caused by a fungal infection. In theses cases, your health care provider will prescribe antifungal medicine. For some cases of chronic sinusitis, surgery is needed. Generally, these are cases in which sinusitis recurs more than 3 times per year, despite other treatments. HOME CARE INSTRUCTIONS   Drink plenty of water. Water helps thin the mucus so your sinuses can drain more easily.  Use a humidifier.  Inhale steam 3 to 4 times a day (for example, sit in the bathroom with the shower running).  Apply a warm, moist washcloth to your face 3 to 4 times a day, or as directed by your health care provider.  Use saline nasal sprays to help moisten and clean your sinuses.  Take medicines only as directed by your health care provider.    If you were prescribed either an antibiotic or antifungal medicine, finish it all even if you start to feel better. SEEK IMMEDIATE MEDICAL CARE IF:  You have increasing pain or severe headaches.  You have nausea, vomiting, or drowsiness.  You have swelling around your face.  You have vision problems.  You have a stiff  neck.  You have difficulty breathing. MAKE SURE YOU:   Understand these instructions.  Will watch your condition.  Will get help right away if you are not doing well or get worse. Document Released: 03/08/2005 Document Revised: 07/23/2013 Document Reviewed: 03/23/2011 Exit[employer] Patient Information 2015 [employer]. This information is not intended to replace advice given to you by your health care provider. Make sure you discuss any questions you have with your health care provider.  - Take meds as prescribed - Use a cool mist humidifier  -Use saline nose sprays frequently -Saline irrigations of the nose can be very helpful if done frequently.  * 4X daily for 1 week*  * Use of a nettie pot can be helpful with this. Follow directions with this* -Force fluids -For any cough or congestion  Use plain Mucinex- regular strength or max strength is fine   * Children- consult with Pharmacist for dosing -For fever or aces or pains- take tylenol or ibuprofen appropriate for age and weight.  * for fevers greater than 101 orally you may alternate ibuprofen and tylenol every  3 hours. -Throat lozenges if help   Wheeler [employer]orvaia, FNP  

## 2014-10-09 ENCOUNTER — Other Ambulatory Visit: Payer: Self-pay | Admitting: Obstetrics and Gynecology

## 2014-10-14 LAB — CYTOLOGY - PAP

## 2015-02-18 ENCOUNTER — Ambulatory Visit
Admission: RE | Admit: 2015-02-18 | Discharge: 2015-02-18 | Disposition: A | Discharge status: Home / Self Care | Payer: 59 | Source: Ambulatory Visit | Attending: Chiropractic Medicine | Admitting: Chiropractic Medicine

## 2015-02-18 ENCOUNTER — Other Ambulatory Visit: Payer: Self-pay | Admitting: Chiropractic Medicine

## 2015-02-18 DIAGNOSIS — M545 Low back pain: Secondary | ICD-10-CM

## 2015-02-21 ENCOUNTER — Ambulatory Visit (INDEPENDENT_AMBULATORY_CARE_PROVIDER_SITE_OTHER): Payer: 59 | Admitting: Family Medicine

## 2015-02-21 ENCOUNTER — Encounter: Payer: Self-pay | Admitting: Family Medicine

## 2015-02-21 VITALS — BP 119/78 | HR 68 | Temp 97.0°F | Ht 71.0 in | Wt 176.4 lb

## 2015-02-21 DIAGNOSIS — J209 Acute bronchitis, unspecified: Secondary | ICD-10-CM

## 2015-02-21 DIAGNOSIS — M62830 Muscle spasm of back: Secondary | ICD-10-CM

## 2015-02-21 MED ORDER — HYDROCODONE-HOMATROPINE 5-1.5 MG/5ML PO SYRP: 5.0000 mL | ORAL_SOLUTION | Freq: Four times a day (QID) | ORAL | Status: DC | PRN

## 2015-02-21 MED ORDER — CEFDINIR 300 MG PO CAPS: 300.0000 mg | ORAL_CAPSULE | Freq: Two times a day (BID) | ORAL | Status: DC

## 2015-02-21 MED ORDER — NAPROXEN 500 MG PO TABS: 500.0000 mg | ORAL_TABLET | Freq: Two times a day (BID) | ORAL | Status: DC

## 2015-02-21 MED ORDER — CYCLOBENZAPRINE HCL 5 MG PO TABS: 5.0000 mg | ORAL_TABLET | Freq: Three times a day (TID) | ORAL | Status: DC | PRN

## 2015-02-21 MED ORDER — FLUTICASONE PROPIONATE 50 MCG/ACT NA SUSP: 2.0000 | Freq: Every day | NASAL | Status: DC
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
## 2015-03-25 ENCOUNTER — Telehealth: Payer: Self-pay | Admitting: Family Medicine

## 2015-03-25 NOTE — Telephone Encounter (Signed)
denied °

## 2015-05-03 ENCOUNTER — Other Ambulatory Visit (INDEPENDENT_AMBULATORY_CARE_PROVIDER_SITE_OTHER): Payer: 59

## 2015-05-03 ENCOUNTER — Other Ambulatory Visit: Payer: Self-pay | Admitting: Family

## 2015-05-03 DIAGNOSIS — R399 Unspecified symptoms and signs involving the genitourinary system: Secondary | ICD-10-CM | POA: Diagnosis not present

## 2015-05-03 DIAGNOSIS — N3000 Acute cystitis without hematuria: Secondary | ICD-10-CM

## 2015-05-03 LAB — POCT UA - MICROSCOPIC ONLY
BACTERIA, U MICROSCOPIC: NEGATIVE
CRYSTALS, UR, HPF, POC: NEGATIVE
Casts, Ur, LPF, POC: NEGATIVE
Mucus, UA: NEGATIVE
Yeast, UA: NEGATIVE

## 2015-05-03 LAB — POCT URINALYSIS DIPSTICK
Bilirubin, UA: NEGATIVE
Glucose, UA: NEGATIVE
Ketones, UA: NEGATIVE
NITRITE UA: NEGATIVE
PH UA: 5
Protein, UA: NEGATIVE
Spec Grav, UA: 1.01
UROBILINOGEN UA: NEGATIVE

## 2015-05-03 MED ORDER — SULFAMETHOXAZOLE-TRIMETHOPRIM 800-160 MG PO TABS: 1.0000 | ORAL_TABLET | Freq: Two times a day (BID) | ORAL | Status: DC

## 2015-05-03 NOTE — Progress Notes (Signed)
Lab only 

## 2015-05-03 NOTE — Addendum Note (Signed)
Addended by: Earlene Plater on: 05/03/2015 10:47 AM   Modules accepted: Orders

## 2015-05-05 LAB — URINE CULTURE

## 2015-05-06 ENCOUNTER — Other Ambulatory Visit: Payer: Self-pay | Admitting: Family

## 2015-08-22 ENCOUNTER — Telehealth: Payer: Self-pay | Admitting: Family Medicine

## 2015-08-22 NOTE — Telephone Encounter (Signed)
Patient states she is out of town and is having diarrhea.  She is scheduled to come home tomorrow and is concerned about the trip back.  The patient was advised to watch her diet, limit intake of heavy foods, to stay hydrated and to start Immodium.  Patient voiced understanding.

## 2015-10-29 ENCOUNTER — Encounter (INDEPENDENT_AMBULATORY_CARE_PROVIDER_SITE_OTHER): Payer: Self-pay | Admitting: *Deleted

## 2015-11-18 ENCOUNTER — Telehealth: Payer: Self-pay | Admitting: Family Medicine

## 2015-11-18 NOTE — Telephone Encounter (Signed)
Denied.

## 2016-08-09 ENCOUNTER — Encounter: Payer: Self-pay | Admitting: Family

## 2016-08-09 ENCOUNTER — Ambulatory Visit (INDEPENDENT_AMBULATORY_CARE_PROVIDER_SITE_OTHER): Payer: 59 | Admitting: Family

## 2016-08-09 VITALS — BP 118/80 | HR 84 | Temp 98.6°F | Ht 71.0 in | Wt 177.2 lb

## 2016-08-09 DIAGNOSIS — R05 Cough: Secondary | ICD-10-CM | POA: Diagnosis not present

## 2016-08-09 DIAGNOSIS — J029 Acute pharyngitis, unspecified: Secondary | ICD-10-CM | POA: Diagnosis not present

## 2016-08-09 DIAGNOSIS — R059 Cough, unspecified: Secondary | ICD-10-CM

## 2016-08-09 DIAGNOSIS — J020 Streptococcal pharyngitis: Secondary | ICD-10-CM

## 2016-08-09 LAB — RAPID STREP SCREEN (MED CTR MEBANE ONLY): Strep Gp A Ag, IA W/Reflex: POSITIVE — AB

## 2016-08-09 MED ORDER — HYDROCODONE-HOMATROPINE 5-1.5 MG/5ML PO SYRP: 5.0000 mL | ORAL_SOLUTION | Freq: Three times a day (TID) | ORAL | 0 refills | Status: DC | PRN

## 2016-08-09 MED ORDER — AMOXICILLIN 875 MG PO TABS: 875.0000 mg | ORAL_TABLET | Freq: Two times a day (BID) | ORAL | 0 refills | Status: DC

## 2016-08-09 NOTE — Progress Notes (Addendum)
   Subjective:    Patient ID: Traci Molina, female    DOB: 11-04-1965, 51 y.o.   MRN: [id number]  Sore Throat   This is a new problem. The current episode started in the past 7 days. The problem has been unchanged. The pain is at a severity of 6/10. The pain is moderate. Associated symptoms include coughing, ear pain, headaches, a hoarse voice, swollen glands and trouble swallowing. Pertinent negatives include no congestion. She has tried acetaminophen and NSAIDs for the symptoms. The treatment provided mild relief.  Cough  Associated symptoms include ear pain and headaches.      Review of Systems  HENT: Positive for ear pain, hoarse voice and trouble swallowing. Negative for congestion.   Respiratory: Positive for cough.   Neurological: Positive for headaches.  All other systems reviewed and are negative.      Objective:   Physical Exam  Constitutional: She is oriented to person, place, and time. She appears well-developed and well-nourished. No distress.  HENT:  Head: Normocephalic and atraumatic.  Right Ear: External ear normal.  Mouth/Throat: Oropharynx is clear and moist.  Eyes: Pupils are equal, round, and reactive to light.  Neck: Normal range of motion. Neck supple. No thyromegaly present.  Cardiovascular: Normal rate, regular rhythm, normal heart sounds and intact distal pulses.   No murmur heard. Pulmonary/Chest: Effort normal and breath sounds normal. No respiratory distress. She has no wheezes.  Abdominal: Soft. Bowel sounds are normal. She exhibits no distension. There is no tenderness.  Musculoskeletal: Normal range of motion. She exhibits no edema or tenderness.  Neurological: She is alert and oriented to person, place, and time. She has normal reflexes. No cranial nerve deficit.  Skin: Skin is warm and dry.  Psychiatric: She has a normal mood and affect. Her behavior is normal. Judgment and thought content normal.  Vitals reviewed.     BP 118/80   Pulse  84   Temp 98.6 F (37 C) (Oral)   Ht 5\' 11"  (1.803 m)   Wt 177 lb 3.2 oz (80.4 kg)   BMI 24.71 kg/m      Assessment & Plan:  1. Sore throat - Rapid strep screen (not at [facility])  2. Strep throat - Take meds as prescribed - Use a cool mist humidifier  -Use saline nose sprays frequently -Saline irrigations of the nose can be very helpful if done frequently.  * 4X daily for 1 week*  * Use of a nettie pot can be helpful with this. Follow directions with this* -Force fluids -For any cough or congestion  Use plain Mucinex- regular strength or max strength is fine   * Children- consult with Pharmacist for dosing -For fever or aces or pains- take tylenol or ibuprofen appropriate for age and weight.  * for fevers greater than 101 orally you may alternate ibuprofen and tylenol every  3 hours. -Throat lozenges if help -New toothbrush in 3 days - amoxicillin (AMOXIL) 875 MG tablet; Take 1 tablet (875 mg total) by mouth 2 (two) times daily.  Dispense: 20 tablet; Refill: 0   Evelina Dun, FNP

## 2016-08-09 NOTE — Addendum Note (Signed)
Addended by: Evelina Dun A on: 08/09/2016 04:56 PM   Modules accepted: Orders

## 2016-08-09 NOTE — Patient Instructions (Signed)
Strep Throat Strep throat is a bacterial infection of the throat. Your health care provider may call the infection tonsillitis or pharyngitis, depending on whether there is swelling in the tonsils or at the back of the throat. Strep throat is most common during the cold months of the year in children who are 5-52 years of age, but it can happen during any season in people of any age. This infection is spread from person to person (contagious) through coughing, sneezing, or close contact. What are the causes? Strep throat is caused by the bacteria called Streptococcus pyogenes. What increases the risk? This condition is more likely to develop in:  People who spend time in crowded places where the infection can spread easily.  People who have close contact with someone who has strep throat.  What are the signs or symptoms? Symptoms of this condition include:  Fever or chills.  Redness, swelling, or pain in the tonsils or throat.  Pain or difficulty when swallowing.  White or yellow spots on the tonsils or throat.  Swollen, tender glands in the neck or under the jaw.  Red rash all over the body (rare).  How is this diagnosed? This condition is diagnosed by performing a rapid strep test or by taking a swab of your throat (throat culture test). Results from a rapid strep test are usually ready in a few minutes, but throat culture test results are available after one or two days. How is this treated? This condition is treated with antibiotic medicine. Follow these instructions at home: Medicines  Take over-the-counter and prescription medicines only as told by your health care provider.  Take your antibiotic as told by your health care provider. Do not stop taking the antibiotic even if you start to feel better.  Have family members who also have a sore throat or fever tested for strep throat. They may need antibiotics if they have the strep infection. Eating and drinking  Do not  share food, drinking cups, or personal items that could cause the infection to spread to other people.  If swallowing is difficult, try eating soft foods until your sore throat feels better.  Drink enough fluid to keep your urine clear or pale yellow. General instructions  Gargle with a salt-water mixture 3-4 times per day or as needed. To make a salt-water mixture, completely dissolve -1 tsp of salt in 1 cup of warm water.  Make sure that all household members wash their hands well.  Get plenty of rest.  Stay home from school or work until you have been taking antibiotics for 24 hours.  Keep all follow-up visits as told by your health care provider. This is important. Contact a health care provider if:  The glands in your neck continue to get bigger.  You develop a rash, cough, or earache.  You cough up a thick liquid that is green, yellow-brown, or bloody.  You have pain or discomfort that does not get better with medicine.  Your problems seem to be getting worse rather than better.  You have a fever. Get help right away if:  You have new symptoms, such as vomiting, severe headache, stiff or painful neck, chest pain, or shortness of breath.  You have severe throat pain, drooling, or changes in your voice.  You have swelling of the neck, or the skin on the neck becomes red and tender.  You have signs of dehydration, such as fatigue, dry mouth, and decreased urination.  You become increasingly sleepy, or   you cannot wake up completely.  Your joints become red or painful. This information is not intended to replace advice given to you by your health care provider. Make sure you discuss any questions you have with your health care provider. Document Released: 03/05/2000 Document Revised: 11/05/2015 Document Reviewed: 07/01/2014 Elsevier Interactive Patient Education  2017 Elsevier Inc.  

## 2016-08-19 ENCOUNTER — Ambulatory Visit: Payer: 59 | Admitting: Nurse Practitioner

## 2016-08-19 ENCOUNTER — Telehealth: Payer: Self-pay | Admitting: Family Medicine

## 2016-08-19 NOTE — Telephone Encounter (Signed)
Pt scheduled for 6:30 in the after hours clinic but wants to know if you would send in antibiotic so she doesn't have to come in since she is over an hour away and is leaving at 4:00 am to go out of town.

## 2016-08-19 NOTE — Telephone Encounter (Signed)
Pt called and left voicemail.

## 2016-08-19 NOTE — Telephone Encounter (Signed)
Patient will do evisit

## 2016-08-19 NOTE — Telephone Encounter (Signed)
What symptoms do you have? Burning with urination and smell  How long have you been sick? One day or two  Have you been seen for this problem? No she was seen for strep on Aug 09, 2016 and finished antibiotic today thinks that is what caused UTI  If your provider decides to give you a prescription, which pharmacy would you like for it to be sent to? [facility] [place]   Patient informed that this information will be sent to the clinical staff for review and that they should receive a follow up call.

## 2016-10-14 ENCOUNTER — Telehealth: Payer: Self-pay | Admitting: Pediatrics

## 2016-10-15 NOTE — Telephone Encounter (Signed)
Did you call patient? I do not see anything in the chart.

## 2016-10-15 NOTE — Telephone Encounter (Signed)
I called about her mom. Will call back.

## 2016-11-03 DIAGNOSIS — Z13820 Encounter for screening for osteoporosis: Secondary | ICD-10-CM | POA: Diagnosis not present

## 2016-11-03 DIAGNOSIS — Z01419 Encounter for gynecological examination (general) (routine) without abnormal findings: Secondary | ICD-10-CM | POA: Diagnosis not present

## 2016-11-03 DIAGNOSIS — Z1231 Encounter for screening mammogram for malignant neoplasm of breast: Secondary | ICD-10-CM | POA: Diagnosis not present

## 2017-09-27 ENCOUNTER — Encounter: Payer: Self-pay | Admitting: Nurse Practitioner

## 2017-09-27 ENCOUNTER — Ambulatory Visit: Payer: 59 | Admitting: Nurse Practitioner

## 2017-09-27 VITALS — BP 109/72 | HR 73 | Temp 97.5°F | Ht 71.0 in | Wt 178.0 lb

## 2017-09-27 DIAGNOSIS — R590 Localized enlarged lymph nodes: Secondary | ICD-10-CM | POA: Diagnosis not present

## 2017-09-27 MED ORDER — CEPHALEXIN 500 MG PO CAPS: 500.0000 mg | ORAL_CAPSULE | Freq: Two times a day (BID) | ORAL | 0 refills | Status: DC

## 2017-09-27 NOTE — Patient Instructions (Signed)
Lymphadenopathy Lymphadenopathy refers to swollen or enlarged lymph glands, also called lymph nodes. Lymph glands are part of your body's defense (immune) system, which protects the body from infections, germs, and diseases. Lymph glands are found in many locations in your body, including the neck, underarm, and groin. Many things can cause lymph glands to become enlarged. When your immune system responds to germs, such as viruses or bacteria, infection-fighting cells and fluid build up. This causes the glands to grow in size. Usually, this is not something to worry about. The swelling and any soreness often go away without treatment. However, swollen lymph glands can also be caused by a number of diseases. Your health care provider may do various tests to help determine the cause. If the cause of your swollen lymph glands cannot be found, it is important to monitor your condition to make sure the swelling goes away. Follow these instructions at home: Watch your condition for any changes. The following actions may help to lessen any discomfort you are feeling:  Get plenty of rest.  Take medicines only as directed by your health care provider. Your health care provider may recommend over-the-counter medicines for pain.  Apply moist heat compresses to the site of swollen lymph nodes as directed by your health care provider. This can help reduce any pain.  Check your lymph nodes daily for any changes.  Keep all follow-up visits as directed by your health care provider. This is important.  Contact a health care provider if:  Your lymph nodes are still swollen after 2 weeks.  Your swelling increases or spreads to other areas.  Your lymph nodes are hard, seem fixed to the skin, or are growing rapidly.  Your skin over the lymph nodes is red and inflamed.  You have a fever.  You have chills.  You have fatigue.  You develop a sore throat.  You have abdominal pain.  You have weight  loss.  You have night sweats. Get help right away if:  You notice fluid leaking from the area of the enlarged lymph node.  You have severe pain in any area of your body.  You have chest pain.  You have shortness of breath. This information is not intended to replace advice given to you by your health care provider. Make sure you discuss any questions you have with your health care provider. Document Released: 12/16/2007 Document Revised: 08/14/2015 Document Reviewed: 10/11/2013 Elsevier Interactive Patient Education  2018 Elsevier Inc.  

## 2017-09-27 NOTE — Progress Notes (Signed)
   Subjective:    Patient ID: Traci Molina, female    DOB: 1965/05/20, 52 y.o.   MRN: [id number]   Chief Complaint: Knot behind left ear   HPI Patient comes in today c/o a knot behind left ear. Noticed it Sunday and got bigger on Monday but is smaller now. Slightly tender to touch.   Review of Systems  Constitutional: Negative.   HENT: Negative.  Negative for ear pain, sore throat and trouble swallowing.   Respiratory: Negative for cough.   Cardiovascular: Negative.   Musculoskeletal: Negative.   Neurological: Negative.   Psychiatric/Behavioral: Negative.   All other systems reviewed and are negative.      Objective:   Physical Exam  Constitutional: She appears well-developed and well-nourished. No distress.  HENT:  Right Ear: Hearing, tympanic membrane, external ear and ear canal normal.  Left Ear: Hearing, tympanic membrane, external ear and ear canal normal.  Nose: Nose normal.  Mouth/Throat: Uvula is midline and mucous membranes are normal.  Cardiovascular: Normal rate.  Pulmonary/Chest: Effort normal.  Lymphadenopathy:    She has cervical adenopathy (right anterior cervical lymphadenopathy- 3cm slightly tender.).  Skin: Skin is warm and dry.  Psychiatric: She has a normal mood and affect. Her behavior is normal. Thought content normal.  Nursing note and vitals reviewed.   BP 109/72   Pulse 73   Temp (!) 97.5 F (36.4 C) (Oral)   Ht 5\' 11"  (1.803 m)   Wt 178 lb (80.7 kg)   BMI 24.83 kg/m       Assessment & Plan:  Traci Molina in today with chief complaint of Knot behind left ear   1. Cervical lymphadenopathy Watch area If not resolvedin 1 week will order U/S  If worsens let me know - cephALEXin (KEFLEX) 500 MG capsule; Take 1 capsule (500 mg total) by mouth 2 (two) times daily.  Dispense: 14 capsule; Refill: 0  Mary-Margaret Hassell Done, FNP

## 2017-12-30 DIAGNOSIS — Z6822 Body mass index (BMI) 22.0-22.9, adult: Secondary | ICD-10-CM | POA: Diagnosis not present

## 2017-12-30 DIAGNOSIS — Z01419 Encounter for gynecological examination (general) (routine) without abnormal findings: Secondary | ICD-10-CM | POA: Diagnosis not present

## 2017-12-30 DIAGNOSIS — Z1231 Encounter for screening mammogram for malignant neoplasm of breast: Secondary | ICD-10-CM | POA: Diagnosis not present

## 2018-01-31 DIAGNOSIS — Z1211 Encounter for screening for malignant neoplasm of colon: Secondary | ICD-10-CM | POA: Diagnosis not present

## 2018-03-06 DIAGNOSIS — Z1211 Encounter for screening for malignant neoplasm of colon: Secondary | ICD-10-CM | POA: Diagnosis not present

## 2018-03-06 DIAGNOSIS — K635 Polyp of colon: Secondary | ICD-10-CM | POA: Diagnosis not present

## 2018-03-06 DIAGNOSIS — D122 Benign neoplasm of ascending colon: Secondary | ICD-10-CM | POA: Diagnosis not present

## 2018-03-06 DIAGNOSIS — D120 Benign neoplasm of cecum: Secondary | ICD-10-CM | POA: Diagnosis not present

## 2021-05-05 ENCOUNTER — Ambulatory Visit (INDEPENDENT_AMBULATORY_CARE_PROVIDER_SITE_OTHER): Payer: PRIVATE HEALTH INSURANCE

## 2021-05-05 ENCOUNTER — Encounter: Payer: Self-pay | Admitting: Family Medicine

## 2021-05-05 ENCOUNTER — Ambulatory Visit (INDEPENDENT_AMBULATORY_CARE_PROVIDER_SITE_OTHER): Payer: PRIVATE HEALTH INSURANCE | Admitting: Family Medicine

## 2021-05-05 VITALS — BP 118/66 | HR 79 | Temp 97.6°F | Ht 71.0 in | Wt 158.0 lb

## 2021-05-05 DIAGNOSIS — Z8249 Family history of ischemic heart disease and other diseases of the circulatory system: Secondary | ICD-10-CM | POA: Diagnosis not present

## 2021-05-05 DIAGNOSIS — M25562 Pain in left knee: Secondary | ICD-10-CM

## 2021-05-05 DIAGNOSIS — M7989 Other specified soft tissue disorders: Secondary | ICD-10-CM | POA: Diagnosis not present

## 2021-05-05 MED ORDER — DICLOFENAC SODIUM 1 % EX GEL: 2.0000 g | Freq: Four times a day (QID) | CUTANEOUS | 1 refills | Status: AC

## 2021-05-05 NOTE — Patient Instructions (Signed)
Need to follow up with PCP for complete physical exam.

## 2021-05-05 NOTE — Progress Notes (Signed)
Subjective:  Patient ID: Traci Molina, female    DOB: 08/29/1965, 56 y.o.   MRN: [id number]  Patient Care Team: Chevis Pretty, FNP as PCP - General (Family Medicine)   Chief Complaint:  Leg Pain   HPI: Traci Molina is a 56 y.o. female presenting on 05/05/2021 for Leg Pain   Pt presents today with complaints of left calf, left knee, and left thigh pain. States this started several days ago and seems to be worsening. She states she did have some swelling in her calf, none present today. She states she now has swelling in her knee. No known injury. Denies recent long travel. No recent procedures. She is concerned because her mother had DVTs in the past.   Leg Pain  The incident occurred more than 1 week ago. There was no injury mechanism. The pain is present in the left knee and left thigh (left calf). The quality of the pain is described as aching, burning and shooting. The pain is at a severity of 5/10. The pain is moderate. The pain has been Fluctuating since onset. Pertinent negatives include no inability to bear weight, loss of motion, loss of sensation, muscle weakness, numbness or tingling. She reports no foreign bodies present. The symptoms are aggravated by movement, palpation and weight bearing. She has tried nothing for the symptoms. The treatment provided no relief.     Relevant past medical, surgical, family, and social history reviewed and updated as indicated.  Allergies and medications reviewed and updated. Data reviewed: Chart in Epic.   Past Medical History:  Diagnosis Date   No pertinent past medical history     Past Surgical History:  Procedure Laterality Date   ABDOMINAL HYSTERECTOMY     APPENDECTOMY     BILIARY STENT PLACEMENT  06/09/2011   Procedure: BILIARY STENT PLACEMENT;  Surgeon: Rogene Houston, MD;  Location: [facility];  Service: Endoscopy;;   CHOLECYSTECTOMY  06/11/2011   Procedure: LAPAROSCOPIC CHOLECYSTECTOMY;  Surgeon: Donato Heinz, MD;  Location: [facility];  Service: General;  Laterality: N/A;   ERCP  06/09/2011   Procedure: ENDOSCOPIC RETROGRADE CHOLANGIOPANCREATOGRAPHY (ERCP);  Surgeon: Rogene Houston, MD;  Location: [facility];  Service: Endoscopy;  Laterality: N/A;  scope out time 1424   ERCP  08/30/2011   Procedure: ENDOSCOPIC RETROGRADE CHOLANGIOPANCREATOGRAPHY (ERCP);  Surgeon: Rogene Houston, MD;  Location: [facility];  Service: Endoscopy;  Laterality: N/A;  biliary stent removal   GALLBLADDER SURGERY     REMOVAL OF STONES  08/30/2011   Procedure: REMOVAL OF STONES;  Surgeon: Rogene Houston, MD;  Location: [facility];  Service: Endoscopy;  Laterality: N/A;  balloon extraction removal of stone fragments    SPHINCTEROTOMY  06/09/2011   Procedure: SPHINCTEROTOMY;  Surgeon: Rogene Houston, MD;  Location: [facility];  Service: Endoscopy;;    Social History   Socioeconomic History   Marital status: Married    Spouse name: Not on file   Number of children: Not on file   Years of education: Not on file   Highest education level: Not on file  Occupational History   Not on file  Tobacco Use   Smoking status: Never   Smokeless tobacco: Never  Substance and Sexual Activity   Alcohol use: No   Drug use: No   Sexual activity: Yes    Birth control/protection: Surgical  Other Topics Concern   Not on file  Social History Narrative   Not on file  Social Determinants of Health   Financial Resource Strain: Not on file  Food Insecurity: Not on file  Transportation Needs: Not on file  Physical Activity: Not on file  Stress: Not on file  Social Connections: Not on file  Intimate Partner Violence: Not on file    Outpatient Encounter Medications as of 05/05/2021  Medication Sig   [DISCONTINUED] cephALEXin (KEFLEX) 500 MG capsule Take 1 capsule (500 mg total) by mouth 2 (two) times daily.   [DISCONTINUED] diphenhydramine-acetaminophen (TYLENOL PM) 25-500 MG TABS Take 1 tablet by mouth once.     No facility-administered  encounter medications on file as of 05/05/2021.    No Known Allergies  Review of Systems  Constitutional:  Negative for activity change, appetite change, chills, diaphoresis, fatigue, fever and unexpected weight change.  HENT: Negative.    Eyes: Negative.   Respiratory:  Negative for cough, chest tightness and shortness of breath.   Cardiovascular:  Negative for chest pain, palpitations and leg swelling.  Gastrointestinal:  Negative for abdominal pain, blood in stool, constipation, diarrhea, nausea and vomiting.  Endocrine: Negative.   Genitourinary:  Negative for dysuria, frequency and urgency.  Musculoskeletal:  Positive for arthralgias, gait problem and joint swelling. Negative for back pain, myalgias, neck pain and neck stiffness.  Skin: Negative.   Allergic/Immunologic: Negative.   Neurological:  Negative for dizziness, tingling, tremors, seizures, syncope, facial asymmetry, speech difficulty, weakness, light-headedness, numbness and headaches.  Hematological: Negative.   Psychiatric/Behavioral:  Negative for confusion, hallucinations, sleep disturbance and suicidal ideas.   All other systems reviewed and are negative.      Objective:  BP 118/66    Pulse 79    Temp 97.6 F (36.4 C) (Temporal)    Ht '5\' 11"'  (1.803 m)    Wt 158 lb (71.7 kg)    BMI 22.04 kg/m    Wt Readings from Last 3 Encounters:  05/05/21 158 lb (71.7 kg)  09/27/17 178 lb (80.7 kg)  08/09/16 177 lb 3.2 oz (80.4 kg)    Physical Exam Vitals and nursing note reviewed.  Constitutional:      General: She is not in acute distress.    Appearance: Normal appearance. She is well-developed, well-groomed and normal weight. She is not ill-appearing, toxic-appearing or diaphoretic.  HENT:     Head: Normocephalic and atraumatic.     Jaw: There is normal jaw occlusion.     Right Ear: Hearing normal.     Left Ear: Hearing normal.     Nose: Nose normal.     Mouth/Throat:     Lips: Pink.     Mouth: Mucous membranes  are moist.     Pharynx: Oropharynx is clear. Uvula midline.  Eyes:     General: Lids are normal.     Extraocular Movements: Extraocular movements intact.     Conjunctiva/sclera: Conjunctivae normal.     Pupils: Pupils are equal, round, and reactive to light.  Neck:     Thyroid: No thyroid mass, thyromegaly or thyroid tenderness.     Vascular: No carotid bruit or JVD.     Trachea: Trachea and phonation normal.  Cardiovascular:     Rate and Rhythm: Normal rate and regular rhythm.     Chest Wall: PMI is not displaced.     Pulses: Normal pulses.     Heart sounds: Normal heart sounds. No murmur heard.   No friction rub. No gallop.  Pulmonary:     Effort: Pulmonary effort is normal. No respiratory distress.  Breath sounds: Normal breath sounds. No wheezing.  Abdominal:     General: Bowel sounds are normal. There is no distension or abdominal bruit.     Palpations: Abdomen is soft. There is no hepatomegaly or splenomegaly.     Tenderness: There is no abdominal tenderness. There is no right CVA tenderness or left CVA tenderness.     Hernia: No hernia is present.  Musculoskeletal:        General: Normal range of motion.     Cervical back: Normal range of motion and neck supple.     Right upper leg: Normal.     Left upper leg: Normal.     Right knee: Normal.     Left knee: Swelling present. No deformity, effusion, erythema, ecchymosis, lacerations, bony tenderness or crepitus. Normal range of motion. Tenderness present over the medial joint line and lateral joint line. No MCL, LCL, ACL, PCL or patellar tendon tenderness. No LCL laxity, MCL laxity, ACL laxity or PCL laxity.Normal alignment, normal meniscus and normal patellar mobility. Normal pulse.     Instability Tests: Anterior drawer test negative. Posterior drawer test negative. Anterior Lachman test negative. Medial McMurray test negative and lateral McMurray test negative.     Right lower leg: Normal. No swelling. No edema.     Left  lower leg: Normal. No swelling. No edema.  Lymphadenopathy:     Cervical: No cervical adenopathy.  Skin:    General: Skin is warm and dry.     Capillary Refill: Capillary refill takes less than 2 seconds.     Coloration: Skin is not cyanotic, jaundiced or pale.     Findings: No rash.  Neurological:     General: No focal deficit present.     Mental Status: She is alert and oriented to person, place, and time.     Sensory: Sensation is intact.     Motor: Motor function is intact.     Coordination: Coordination is intact.     Gait: Gait is intact.     Deep Tendon Reflexes: Reflexes are normal and symmetric.  Psychiatric:        Attention and Perception: Attention and perception normal.        Mood and Affect: Mood and affect normal.        Speech: Speech normal.        Behavior: Behavior normal. Behavior is cooperative.        Thought Content: Thought content normal.        Cognition and Memory: Cognition and memory normal.        Judgment: Judgment normal.    Results for orders placed or performed in visit on 08/09/16  Rapid strep screen (not at [facility])   Specimen: Other   OTHER  Result Value Ref Range   Strep Gp A Ag, IA W/Reflex Positive (A) Negative     X-Ray: left knee: No acute findings. Preliminary x-ray reading by Monia Pouch, FNP-C, [facility].   Pertinent labs & imaging results that were available during my care of the patient were reviewed by me and considered in my medical decision making.  Assessment & Plan:  Aleksandra was seen today for leg pain.  Diagnoses and all orders for this visit:  Acute pain of left knee Left leg swelling Family history of DVT Imaging unremarkable in office. No significant concerns for DVT but will obtain D-Dimer as pt has a significant family history of DVT. Will place in brace today and treat with Voltaren gel. Will obtain  labs as pt has not been seen in over 3 years. Will need renal function and CBC if anticoagulation needs to be started.  Symptomatic care discussed in detail. Follow up in 4-6 weeks or sooner if warranted.  -     CBC with Differential/Platelet -     BMP8+EGFR -     D-dimer, quantitative     Continue all other maintenance medications.  Follow up plan: Return if symptoms worsen or fail to improve.   Continue healthy lifestyle choices, including diet (rich in fruits, vegetables, and lean proteins, and low in salt and simple carbohydrates) and exercise (at least 30 minutes of moderate physical activity daily).  Educational handout given for knee pain  The above assessment and management plan was discussed with the patient. The patient verbalized understanding of and has agreed to the management plan. Patient is aware to call the clinic if they develop any new symptoms or if symptoms persist or worsen. Patient is aware when to return to the clinic for a follow-up visit. Patient educated on when it is appropriate to go to the emergency department.   Monia Pouch, FNP-C [facility] [phone]

## 2021-05-06 LAB — CBC WITH DIFFERENTIAL/PLATELET
Basophils Absolute: 0 10*3/uL (ref 0.0–0.2)
Basos: 0 %
EOS (ABSOLUTE): 0 10*3/uL (ref 0.0–0.4)
Eos: 1 %
Hematocrit: 34.7 % (ref 34.0–46.6)
Hemoglobin: 11.4 g/dL (ref 11.1–15.9)
Immature Grans (Abs): 0 10*3/uL (ref 0.0–0.1)
Immature Granulocytes: 0 %
Lymphocytes Absolute: 1 10*3/uL (ref 0.7–3.1)
Lymphs: 18 %
MCH: 27.1 pg (ref 26.6–33.0)
MCHC: 32.9 g/dL (ref 31.5–35.7)
MCV: 82 fL (ref 79–97)
Monocytes Absolute: 0.5 10*3/uL (ref 0.1–0.9)
Monocytes: 9 %
Neutrophils Absolute: 4 10*3/uL (ref 1.4–7.0)
Neutrophils: 72 %
Platelets: 177 10*3/uL (ref 150–450)
RBC: 4.21 x10E6/uL (ref 3.77–5.28)
RDW: 12.9 % (ref 11.7–15.4)
WBC: 5.6 10*3/uL (ref 3.4–10.8)

## 2021-05-06 LAB — BMP8+EGFR
BUN/Creatinine Ratio: 13 (ref 9–23)
BUN: 12 mg/dL (ref 6–24)
CO2: 25 mmol/L (ref 20–29)
Calcium: 9.4 mg/dL (ref 8.7–10.2)
Chloride: 106 mmol/L (ref 96–106)
Creatinine, Ser: 0.95 mg/dL (ref 0.57–1.00)
Glucose: 88 mg/dL (ref 70–99)
Potassium: 4.6 mmol/L (ref 3.5–5.2)
Sodium: 142 mmol/L (ref 134–144)
eGFR: 70 mL/min/{1.73_m2} (ref >59)

## 2021-05-06 LAB — D-DIMER, QUANTITATIVE: D-DIMER: 0.46 mg/L FEU (ref 0.00–0.49)

## 2021-06-12 ENCOUNTER — Telehealth: Payer: Self-pay | Admitting: Nurse Practitioner

## 2021-06-12 DIAGNOSIS — M7989 Other specified soft tissue disorders: Secondary | ICD-10-CM

## 2021-06-12 DIAGNOSIS — M25562 Pain in left knee: Secondary | ICD-10-CM

## 2021-06-12 NOTE — Telephone Encounter (Signed)
REFERRAL REQUEST ?Telephone Note ? ?Have you been seen at our office for this problem? yes ?(Advise that they may need an appointment with their PCP before a referral can be done) ? ?Reason for Referral: left knee in brace ?Referral discussed with patient: yes  ?Best contact number of patient for referral team: [phone]    ?Has patient been seen by a specialist for this issue before: no  ?Patient provider preference for referral: [facility] ?Patient location preference for referral: [place] ?  ?Patient notified that referrals can take up to a week or longer to process. If they haven't heard anything within a week they should call back and speak with the referral department.   ? ?Pt seen R[facility] about 6 weeks ago. ? ?Please call pt. ?

## 2021-09-08 ENCOUNTER — Ambulatory Visit: Payer: Commercial Managed Care - PPO | Attending: Physician Assistant | Admitting: Physical Therapy

## 2021-09-08 DIAGNOSIS — M25662 Stiffness of left knee, not elsewhere classified: Secondary | ICD-10-CM | POA: Insufficient documentation

## 2021-09-08 DIAGNOSIS — M6281 Muscle weakness (generalized): Secondary | ICD-10-CM | POA: Insufficient documentation

## 2021-09-08 DIAGNOSIS — M25562 Pain in left knee: Secondary | ICD-10-CM | POA: Insufficient documentation

## 2021-09-08 DIAGNOSIS — R600 Localized edema: Secondary | ICD-10-CM | POA: Insufficient documentation

## 2021-09-08 NOTE — Therapy (Signed)
[facility] [place], [place], [address] Phone: [phone]   Fax:  [phone]  Physical Therapy Evaluation  Patient Details  Name: Traci Molina MRN: [id number] Date of Birth: Mar 28, 1965 Referring Provider (PT): Merla Riches PA-C   Encounter Date: 09/08/2021   PT End of Session - 09/08/21 1306     Visit Number 1    Number of Visits 12    Date for PT Re-Evaluation 10/06/21    Authorization Type FOTO.    PT Start Time [phone]    PT Stop Time 1027    PT Time Calculation (min) 41 min    Activity Tolerance Patient tolerated treatment well    Behavior During Therapy WFL for tasks assessed/performed             Past Medical History:  Diagnosis Date   No pertinent past medical history     Past Surgical History:  Procedure Laterality Date   ABDOMINAL HYSTERECTOMY     APPENDECTOMY     BILIARY STENT PLACEMENT  06/09/2011   Procedure: BILIARY STENT PLACEMENT;  Surgeon: Rogene Houston, MD;  Location: [facility];  Service: Endoscopy;;   CHOLECYSTECTOMY  06/11/2011   Procedure: LAPAROSCOPIC CHOLECYSTECTOMY;  Surgeon: Donato Heinz, MD;  Location: [facility];  Service: General;  Laterality: N/A;   ERCP  06/09/2011   Procedure: ENDOSCOPIC RETROGRADE CHOLANGIOPANCREATOGRAPHY (ERCP);  Surgeon: Rogene Houston, MD;  Location: [facility];  Service: Endoscopy;  Laterality: N/A;  scope out time 1424   ERCP  08/30/2011   Procedure: ENDOSCOPIC RETROGRADE CHOLANGIOPANCREATOGRAPHY (ERCP);  Surgeon: Rogene Houston, MD;  Location: [facility];  Service: Endoscopy;  Laterality: N/A;  biliary stent removal   GALLBLADDER SURGERY     REMOVAL OF STONES  08/30/2011   Procedure: REMOVAL OF STONES;  Surgeon: Rogene Houston, MD;  Location: [facility];  Service: Endoscopy;  Laterality: N/A;  balloon extraction removal of stone fragments    SPHINCTEROTOMY  06/09/2011   Procedure: SPHINCTEROTOMY;  Surgeon: Rogene Houston, MD;  Location: [facility];  Service: Endoscopy;;     There were no vitals filed for this visit.    Subjective Assessment - 09/08/21 1307     Subjective The patient presents to the clinic today s/p left knee arthroscopic surgery perform on 08/19/21.  her pain is rated at a 4/10 today.  She reports limitations of bending.  She has occasional throbbing at times and some swelling reported as well.  She presented to the clinic weight bearing as tolerated without assistive device.  Rest decreases  and increased activity increases pain.    Pertinent History Unremarkable.    How long can you stand comfortably? Varies.    How long can you walk comfortably? Varies.    Patient Stated Goals Get back to pre-injury status.    Currently in Pain? Yes                [facility] PT Assessment - 09/08/21 0001       Assessment   Medical Diagnosis Encounter for other orthopedicaftercare.    Referring Provider (PT) Merla Riches PA-C    Onset Date/Surgical Date --   04/2021.     Precautions   Precaution Comments Gentle range of motion.  Pain-free ther ex.      Restrictions   Weight Bearing Restrictions No      Balance Screen   Has the patient fallen in the past 6 months No    Is the patient reluctant  to leave their home because of a fear of falling?  No      Home Environment   Living Environment Private residence      Prior Function   Level of Independence Independent      Observation/Other Assessments   Focus on Therapeutic Outcomes (FOTO)  Complete.      Observation/Other Assessments-Edema    Edema Circumferential      Circumferential Edema   Circumferential - Left  LT 1 cm > RT.      ROM / Strength   AROM / PROM / Strength AROM;Strength      AROM   Overall AROM Comments Full left knee extension and flexion to 90 degrees.      Strength   Overall Strength Comments Patient able to perform a left antigravity SLR without extensor lag and an antigravity SAQ.  She has notable left quadriceps atrophy and decreased volitional activation of  this muscle group per contralateral comparison.      Palpation   Palpation comment Tender over left medial joint line.      Ambulation/Gait   Gait Comments Mild antalgia.                        Objective measurements completed on examination: See above findings.       [facility] Adult PT Treatment/Exercise - 09/08/21 0001       Modalities   Modalities Vasopneumatic      Vasopneumatic   Number Minutes Vasopneumatic  15 minutes    Vasopnuematic Location  --   Left knee.   Vasopneumatic Pressure Low                          PT Long Term Goals - 09/08/21 1411       PT LONG TERM GOAL #1   Title Independent with a HEP.    Time 4    Period Weeks    Status New      PT LONG TERM GOAL #2   Title Active left knee flexion to 125 degrees+ so the patient can perform functional tasks and do so with pain not > 2-3/10.    Time 6    Period Weeks    Status New      PT LONG TERM GOAL #3   Title Increase left knee strength to a solid 5/5 to provide good stability for accomplishment of functional activities.    Time 4    Period Weeks    Status New      PT LONG TERM GOAL #4   Title Perform ADL's with left knee pain not > 2/10.    Time 4    Period Weeks    Status New                    Plan - 09/08/21 1403     Clinical Impression Statement The patient presents to OPPT s/p left knee arthropscopic surgery performed on 08/19/21.  She has a loss of active left knee flexion.  She has notable atrophy and decrease volitional activation of her left quadriceps muscle group.  She does, however, exhibit the ability to perform a left SLR against gravity without extensor lag.  She has minimal edema currently.    Patient will benefit from skilled physical therapy intervention to address pain and deficits.    Personal Factors and Comorbidities Other    Examination-Activity Limitations Locomotion Level;Other  Examination-Participation Restrictions Other;Yard  Work    Stability/Clinical Decision Making Stable/Uncomplicated    Designer, jewellery Low    Rehab Potential Excellent    PT Frequency 3x / week    PT Duration 4 weeks    PT Treatment/Interventions ADLs/Self Care Home Management;Cryotherapy;Electrical Stimulation;Ultrasound;Moist Heat;Iontophoresis '4mg'$ /ml Dexamethasone;Functional mobility training;Therapeutic activities;Therapeutic exercise;Patient/family education;Neuromuscular re-education;Manual techniques;Passive range of motion;Vasopneumatic Device    PT Next Visit Plan Nustep with progression to recumbent bike, VMS to left quadriceps, pain-free O and CKC ther ex.  Vasopneumatic.    Consulted and Agree with Plan of Care Patient             Patient will benefit from skilled therapeutic intervention in order to improve the following deficits and impairments:  Abnormal gait, Decreased activity tolerance, Decreased range of motion, Decreased strength, Increased edema, Pain  Visit Diagnosis: Acute pain of left knee  Stiffness of left knee, not elsewhere classified  Localized edema  Muscle weakness (generalized)     Problem List Patient Active Problem List   Diagnosis Date Noted   Choledocholithiasis 08/10/2011   Mild anemia 08/10/2011   Rationale for Evaluation and Treatment Rehabilitation.  Hertha Gergen, [place], PT 09/08/2021, 2:18 PM  [facility] [address] [place], [place], [address] Phone: [phone]   Fax:  [phone]  Name: MALISSIE MUSGRAVE MRN: [id number] Date of Birth: 09/02/1965

## 2021-09-10 ENCOUNTER — Ambulatory Visit: Payer: Commercial Managed Care - PPO | Admitting: Physical Therapy

## 2021-09-10 DIAGNOSIS — M25562 Pain in left knee: Secondary | ICD-10-CM

## 2021-09-10 DIAGNOSIS — R600 Localized edema: Secondary | ICD-10-CM

## 2021-09-10 DIAGNOSIS — M25662 Stiffness of left knee, not elsewhere classified: Secondary | ICD-10-CM

## 2021-09-10 DIAGNOSIS — M6281 Muscle weakness (generalized): Secondary | ICD-10-CM

## 2021-09-10 NOTE — Patient Instructions (Signed)
PROGHROAMME EXERCISE PROGRAM Created by Mali Kharson Rasmusson Jun 22nd, 2023 View at [email or web address] using code: HQR9758 Total 3 Page 1 of 1 KNEE FLEXION STRETCH - SELF ASSISTED While seated in a chair, use your unaffected leg to bend your affected knee until a stretch is felt. Repeat 10 Times Hold 10 Seconds Complete 4 Sets Perform 6 Times a Day SINGLE KNEE TO CHEST STRETCH - SKTC While lying on your back, use your hands and gently draw up a knee towards your chest. Keep your other knee straight and lying on the ground. Repeat 5 Times Hold 10 Seconds Complete 2 Sets Perform 3 Times a Day PEANUT BALL - SHORT ARC QUAD - SAQ Start by placing a peanut ball (rolled up pillow) under your knee. Next, slowly raise your lower leg and foot as you straighten your knee. Return to starting position and repeat. Repeat 20 Times Hold 3 Seconds Complete 2 Sets Perform 2 Times a Day

## 2021-09-10 NOTE — Therapy (Signed)
[facility] [place], [place], [address] Phone: [phone]   Fax:  [phone]  Physical Therapy Treatment  Patient Details  Name: Traci Molina MRN: [id number] Date of Birth: [birth year] Referring Provider (PT): Merla Riches PA-C   Encounter Date: 09/10/2021   PT End of Session - 09/10/21 0940     Visit Number 2    Number of Visits 12    Date for PT Re-Evaluation 10/06/21    Authorization Type FOTO.    PT Start Time 0815    PT Stop Time 0908    PT Time Calculation (min) 53 min    Activity Tolerance Patient tolerated treatment well    Behavior During Therapy WFL for tasks assessed/performed             Past Medical History:  Diagnosis Date   No pertinent past medical history     Past Surgical History:  Procedure Laterality Date   ABDOMINAL HYSTERECTOMY     APPENDECTOMY     BILIARY STENT PLACEMENT  06/09/2011   Procedure: BILIARY STENT PLACEMENT;  Surgeon: Rogene Houston, MD;  Location: [facility];  Service: Endoscopy;;   CHOLECYSTECTOMY  06/11/2011   Procedure: LAPAROSCOPIC CHOLECYSTECTOMY;  Surgeon: Donato Heinz, MD;  Location: [facility];  Service: General;  Laterality: N/A;   ERCP  06/09/2011   Procedure: ENDOSCOPIC RETROGRADE CHOLANGIOPANCREATOGRAPHY (ERCP);  Surgeon: Rogene Houston, MD;  Location: [facility];  Service: Endoscopy;  Laterality: N/A;  scope out time 1424   ERCP  08/30/2011   Procedure: ENDOSCOPIC RETROGRADE CHOLANGIOPANCREATOGRAPHY (ERCP);  Surgeon: Rogene Houston, MD;  Location: [facility];  Service: Endoscopy;  Laterality: N/A;  biliary stent removal   GALLBLADDER SURGERY     REMOVAL OF STONES  08/30/2011   Procedure: REMOVAL OF STONES;  Surgeon: Rogene Houston, MD;  Location: [facility];  Service: Endoscopy;  Laterality: N/A;  balloon extraction removal of stone fragments    SPHINCTEROTOMY  06/09/2011   Procedure: SPHINCTEROTOMY;  Surgeon: Rogene Houston, MD;  Location: [facility];  Service: Endoscopy;;     There were no vitals filed for this visit.   Subjective Assessment - 09/10/21 0942     Subjective No new complaints.    Pertinent History Unremarkable.    How long can you stand comfortably? Varies.    How long can you walk comfortably? Varies.    Patient Stated Goals Get back to pre-injury status.                               [place] Adult PT Treatment/Exercise - 09/10/21 0001       Exercises   Exercises Knee/Hip      Knee/Hip Exercises: Aerobic   Nustep Level 1 x 15 minutes moving seat foward x 2 to increase knee flexion.      Knee/Hip Exercises: Supine   Short Arc Quad Sets Limitations SAQ's x 16 minutes facilitated with Bi-Phasic electrical stimulation with 10 sec extensionholds f/b a 10 sec rest.      Modalities   Modalities Vasopneumatic      Vasopneumatic   Number Minutes Vasopneumatic  15 minutes    Vasopnuematic Location  --   Left knee.   Vasopneumatic Pressure Low                          PT Long Term Goals - 09/08/21 1411  PT LONG TERM GOAL #1   Title Independent with a HEP.    Time 4    Period Weeks    Status New      PT LONG TERM GOAL #2   Title Active left knee flexion to 125 degrees+ so the patient can perform functional tasks and do so with pain not > 2-3/10.    Time 6    Period Weeks    Status New      PT LONG TERM GOAL #3   Title Increase left knee strength to a solid 5/5 to provide good stability for accomplishment of functional activities.    Time 4    Period Weeks    Status New      PT LONG TERM GOAL #4   Title Perform ADL's with left knee pain not > 2/10.    Time 4    Period Weeks    Status New                   Plan - 09/10/21 0944     Clinical Impression Statement Very good left quadriceps activation facilitated with Bi-Phasic electircal stimulation.  Established HEP.    Personal Factors and Comorbidities Other    Examination-Activity Limitations Locomotion Level;Other     Examination-Participation Restrictions Other;Yard Work    Stability/Clinical Decision Making Stable/Uncomplicated    Rehab Potential Excellent    PT Frequency 3x / week    PT Duration 4 weeks    PT Treatment/Interventions ADLs/Self Care Home Management;Cryotherapy;Electrical Stimulation;Ultrasound;Moist Heat;Iontophoresis '4mg'$ /ml Dexamethasone;Functional mobility training;Therapeutic activities;Therapeutic exercise;Patient/family education;Neuromuscular re-education;Manual techniques;Passive range of motion;Vasopneumatic Device    PT Next Visit Plan Nustep with progression to recumbent bike, VMS to left quadriceps, pain-free O and CKC ther ex.  Vasopneumatic.    Consulted and Agree with Plan of Care Patient             Patient will benefit from skilled therapeutic intervention in order to improve the following deficits and impairments:  Abnormal gait, Decreased activity tolerance, Decreased range of motion, Decreased strength, Increased edema, Pain  Visit Diagnosis: Acute pain of left knee  Stiffness of left knee, not elsewhere classified  Localized edema  Muscle weakness (generalized)     Problem List Patient Active Problem List   Diagnosis Date Noted   Choledocholithiasis 08/10/2011   Mild anemia 08/10/2011   Rationale for Evaluation and Treatment Rehabilitation.  Loetta Connelley, [place], PT 09/10/2021, 10:23 AM  [facility] [address] [place], [place], [address] Phone: [phone]   Fax:  [phone]  Name: CORSICA FRANSON MRN: [id number] Date of Birth: 1965-09-24

## 2021-09-16 ENCOUNTER — Ambulatory Visit: Payer: Commercial Managed Care - PPO | Admitting: Physical Therapy

## 2021-09-16 DIAGNOSIS — M6281 Muscle weakness (generalized): Secondary | ICD-10-CM

## 2021-09-16 DIAGNOSIS — M25562 Pain in left knee: Secondary | ICD-10-CM | POA: Diagnosis not present

## 2021-09-16 DIAGNOSIS — M25662 Stiffness of left knee, not elsewhere classified: Secondary | ICD-10-CM

## 2021-09-16 DIAGNOSIS — R600 Localized edema: Secondary | ICD-10-CM

## 2021-09-16 NOTE — Therapy (Signed)
[facility] [place], [place], [address] Phone: [phone]   Fax:  [phone]  Physical Therapy Treatment  Patient Details  Name: Traci Molina MRN: [id number] Date of Birth: December 31, 1965 Referring Provider (PT): Merla Riches PA-C   Encounter Date: 09/16/2021   PT End of Session - 09/16/21 0959     Visit Number 3    Number of Visits 12    Date for PT Re-Evaluation 10/06/21    Authorization Type FOTO.    PT Start Time 0902    PT Stop Time 0939    PT Time Calculation (min) 37 min    Activity Tolerance Patient tolerated treatment well    Behavior During Therapy WFL for tasks assessed/performed             Past Medical History:  Diagnosis Date   No pertinent past medical history     Past Surgical History:  Procedure Laterality Date   ABDOMINAL HYSTERECTOMY     APPENDECTOMY     BILIARY STENT PLACEMENT  06/09/2011   Procedure: BILIARY STENT PLACEMENT;  Surgeon: Rogene Houston, MD;  [facility]: [facility];  Service: Endoscopy;;   CHOLECYSTECTOMY  06/11/2011   Procedure: LAPAROSCOPIC CHOLECYSTECTOMY;  Surgeon: Donato Heinz, MD;  Location: [facility];  Service: General;  Laterality: N/A;   ERCP  06/09/2011   Procedure: ENDOSCOPIC RETROGRADE CHOLANGIOPANCREATOGRAPHY (ERCP);  Surgeon: Rogene Houston, MD;  Location: [facility];  Service: Endoscopy;  Laterality: N/A;  scope out time 1424   ERCP  08/30/2011   Procedure: ENDOSCOPIC RETROGRADE CHOLANGIOPANCREATOGRAPHY (ERCP);  Surgeon: Rogene Houston, MD;  Location: [facility];  Service: Endoscopy;  Laterality: N/A;  biliary stent removal   GALLBLADDER SURGERY     REMOVAL OF STONES  08/30/2011   Procedure: REMOVAL OF STONES;  Surgeon: Rogene Houston, MD;  Location: [facility];  Service: Endoscopy;  Laterality: N/A;  balloon extraction removal of stone fragments    SPHINCTEROTOMY  06/09/2011   Procedure: SPHINCTEROTOMY;  Surgeon: Rogene Houston, MD;  Location: [facility];  Service: Endoscopy;;     There were no vitals filed for this visit.   Subjective Assessment - 09/16/21 1000     Subjective No new complaints.    Pertinent History Unremarkable.    How long can you stand comfortably? Varies.    How long can you walk comfortably? Varies.    Patient Stated Goals Get back to pre-injury status.                [facility] PT Assessment - 09/16/21 0001       AROM   Overall AROM Comments Left knee active flexion to 110 degrees.                           [place] Adult PT Treatment/Exercise - 09/16/21 0001       Exercises   Exercises Knee/Hip      Knee/Hip Exercises: Aerobic   Recumbent Bike Level 1 x 15 minutes moving seat forward x 1 to increaseknee flexion.      Knee/Hip Exercises: Supine   Short Arc Quad Sets Limitations SAQ's x 15 minutes facilitated with Bi-Phasic electrical stimulation to left distal quads wiht 10 sec extension holds f/b a 10 sec rest.                          PT Long Term Goals - 09/08/21 1411  PT LONG TERM GOAL #1   Title Independent with a HEP.    Time 4    Period Weeks    Status New      PT LONG TERM GOAL #2   Title Active left knee flexion to 125 degrees+ so the patient can perform functional tasks and do so with pain not > 2-3/10.    Time 6    Period Weeks    Status New      PT LONG TERM GOAL #3   Title Increase left knee strength to a solid 5/5 to provide good stability for accomplishment of functional activities.    Time 4    Period Weeks    Status New      PT LONG TERM GOAL #4   Title Perform ADL's with left knee pain not > 2/10.    Time 4    Period Weeks    Status New                   Plan - 09/16/21 1003     Clinical Impression Statement Patient is compliant to her HEP and achieved active left knee flexion to 110 degrees today.    Personal Factors and Comorbidities Other    Examination-Activity Limitations Locomotion Level;Other    Examination-Participation  Restrictions Other;Yard Work    Stability/Clinical Decision Making Stable/Uncomplicated    Rehab Potential Excellent    PT Frequency 3x / week    PT Duration 4 weeks    PT Treatment/Interventions ADLs/Self Care Home Management;Cryotherapy;Electrical Stimulation;Ultrasound;Moist Heat;Iontophoresis '4mg'$ /ml Dexamethasone;Functional mobility training;Therapeutic activities;Therapeutic exercise;Patient/family education;Neuromuscular re-education;Manual techniques;Passive range of motion;Vasopneumatic Device    PT Next Visit Plan Nustep with progression to recumbent bike, VMS to left quadriceps, pain-free O and CKC ther ex.  Vasopneumatic.    Consulted and Agree with Plan of Care Patient             Patient will benefit from skilled therapeutic intervention in order to improve the following deficits and impairments:  Abnormal gait, Decreased activity tolerance, Decreased range of motion, Decreased strength, Increased edema, Pain  Visit Diagnosis: Acute pain of left knee  Stiffness of left knee, not elsewhere classified  Localized edema  Muscle weakness (generalized)     Problem List Patient Active Problem List   Diagnosis Date Noted   Choledocholithiasis 08/10/2011   Mild anemia 08/10/2011   Rationale for Evaluation and Treatment Rehabilitation.  Chesnie Capell, [place], PT 09/16/2021, 10:07 AM  [facility] [address] [place], [place], [address] Phone: [phone]   Fax:  [phone]  Name: Traci Molina MRN: [id number] Date of Birth: [birth year]

## 2021-09-23 ENCOUNTER — Ambulatory Visit: Payer: Commercial Managed Care - PPO | Attending: Physician Assistant | Admitting: *Deleted

## 2021-09-23 ENCOUNTER — Encounter: Payer: Self-pay | Admitting: *Deleted

## 2021-09-23 DIAGNOSIS — M25562 Pain in left knee: Secondary | ICD-10-CM | POA: Insufficient documentation

## 2021-09-23 DIAGNOSIS — R600 Localized edema: Secondary | ICD-10-CM | POA: Insufficient documentation

## 2021-09-23 DIAGNOSIS — M6281 Muscle weakness (generalized): Secondary | ICD-10-CM | POA: Diagnosis present

## 2021-09-23 DIAGNOSIS — M25662 Stiffness of left knee, not elsewhere classified: Secondary | ICD-10-CM | POA: Diagnosis present

## 2021-09-23 NOTE — Therapy (Signed)
OUTPATIENT PHYSICAL THERAPY TREATMENT NOTE   Patient Name: Traci Molina MRN: [id number] DOB:29-May-1965, 56 y.o., female Today's Date: 09/23/2021   REFERRING PROVIDER:   Merla Riches PA-C      PT End of Session - 09/23/21 1127     Visit Number 4    Number of Visits 12    Date for PT Re-Evaluation 10/06/21    Authorization Type FOTO.    PT Start Time 1115    PT Stop Time 1205    PT Time Calculation (min) 50 min             Past Medical History:  Diagnosis Date   No pertinent past medical history    Past Surgical History:  Procedure Laterality Date   ABDOMINAL HYSTERECTOMY     APPENDECTOMY     BILIARY STENT PLACEMENT  06/09/2011   Procedure: BILIARY STENT PLACEMENT;  Surgeon: Rogene Houston, MD;  Location: [facility];  Service: Endoscopy;;   CHOLECYSTECTOMY  06/11/2011   Procedure: LAPAROSCOPIC CHOLECYSTECTOMY;  Surgeon: Donato Heinz, MD;  Location: [facility];  Service: General;  Laterality: N/A;   ERCP  06/09/2011   Procedure: ENDOSCOPIC RETROGRADE CHOLANGIOPANCREATOGRAPHY (ERCP);  Surgeon: Rogene Houston, MD;  Location: [facility];  Service: Endoscopy;  Laterality: N/A;  scope out time 1424   ERCP  08/30/2011   Procedure: ENDOSCOPIC RETROGRADE CHOLANGIOPANCREATOGRAPHY (ERCP);  Surgeon: Rogene Houston, MD;  Location: [facility];  Service: Endoscopy;  Laterality: N/A;  biliary stent removal   GALLBLADDER SURGERY     REMOVAL OF STONES  08/30/2011   Procedure: REMOVAL OF STONES;  Surgeon: Rogene Houston, MD;  Location: [facility];  Service: Endoscopy;  Laterality: N/A;  balloon extraction removal of stone fragments    SPHINCTEROTOMY  06/09/2011   Procedure: SPHINCTEROTOMY;  Surgeon: Rogene Houston, MD;  Location: [facility];  Service: Endoscopy;;   Patient Active Problem List   Diagnosis Date Noted   Choledocholithiasis 08/10/2011   Mild anemia 08/10/2011    REFERRING DIAG: s/p left knee arthroscopic surgery   THERAPY DIAG:  Acute pain of left knee  Stiffness of left knee, not  elsewhere classified  Localized edema  Muscle weakness (generalized)  Rationale for Evaluation and Treatment Rehabilitation  PERTINENT HISTORY: Unremarkable  PRECAUTIONS:   Gentle range of motion.  Pain-free ther ex.     SUBJECTIVE: LT knee soreness 2-3/10. Only come 1 x week  PAIN:  Are you having pain? Yes: NPRS scale: 3/10 Pain location: LT knee Pain description: sore Aggravating factors: stretching Relieving factors: rest     TODAY'S TREATMENT:                                09-23-21     EXERCISE LOG  Exercise Repetitions and Resistance Comments  Bike X 15 mins, L1-2   Heel ups 2x15   Toe ups 2x15   14in box lunges 2x10 Focus on mild stretch as well as quad control  LAQ's 3# 2x10 hold 5 secs        Blank cell = exercise not performed today    HOME EXERCISE PROGRAM: HEP handout given for exs performed today Heel ups  Toe ups  14in box lunges  LAQ's       PT Long Term Goals - 09/23/21 1129       PT LONG TERM GOAL #1   Title Independent with a HEP.    Time 4  Period Weeks    Status New      PT LONG TERM GOAL #2   Title Active left knee flexion to 125 degrees+ so the patient can perform functional tasks and do so with pain not > 2-3/10.    Time 6    Period Weeks    Status New      PT LONG TERM GOAL #3   Title Increase left knee strength to a solid 5/5 to provide good stability for accomplishment of functional activities.    Time 4    Period Weeks    Status New      PT LONG TERM GOAL #4   Title Perform ADL's with left knee pain not > 2/10.    Time 4    Period Weeks    Status New              Plan - 09/23/21 1129     Clinical Impression Statement Pt arrived today doing fairly well, but still with LT knee soreness.Rx focused on increased flexion ROM as well as quad activation and control. Handout given for HEP   Personal Factors and Comorbidities Other    Examination-Activity Limitations Locomotion Level;Other     Examination-Participation Restrictions Other;Yard Work    Stability/Clinical Decision Making Stable/Uncomplicated    Rehab Potential Excellent    PT Frequency 3x / week    PT Duration 4 weeks    PT Treatment/Interventions ADLs/Self Care Home Management;Cryotherapy;Electrical Stimulation;Ultrasound;Moist Heat;Iontophoresis '4mg'$ /ml Dexamethasone;Functional mobility training;Therapeutic activities;Therapeutic exercise;Patient/family education;Neuromuscular re-education;Manual techniques;Passive range of motion;Vasopneumatic Device    PT Next Visit Plan Nustep with progression to recumbent bike, VMS to left quadriceps, pain-free O and CKC ther ex.  Vasopneumatic.    Consulted and Agree with Plan of Care Patient               Dimitria Ketchum,CHRIS, PTA 09/23/2021, 2:28 PM

## 2021-09-30 ENCOUNTER — Encounter: Payer: Commercial Managed Care - PPO | Admitting: Physical Therapy

## 2022-04-14 DIAGNOSIS — Z6820 Body mass index (BMI) 20.0-20.9, adult: Secondary | ICD-10-CM | POA: Diagnosis not present

## 2022-04-14 DIAGNOSIS — Z01419 Encounter for gynecological examination (general) (routine) without abnormal findings: Secondary | ICD-10-CM | POA: Diagnosis not present

## 2022-04-14 DIAGNOSIS — Z1272 Encounter for screening for malignant neoplasm of vagina: Secondary | ICD-10-CM | POA: Diagnosis not present

## 2022-04-14 DIAGNOSIS — Z1151 Encounter for screening for human papillomavirus (HPV): Secondary | ICD-10-CM | POA: Diagnosis not present

## 2022-04-14 DIAGNOSIS — Z1231 Encounter for screening mammogram for malignant neoplasm of breast: Secondary | ICD-10-CM | POA: Diagnosis not present

## 2022-05-10 DIAGNOSIS — H93299 Other abnormal auditory perceptions, unspecified ear: Secondary | ICD-10-CM | POA: Diagnosis not present

## 2022-05-10 DIAGNOSIS — H6121 Impacted cerumen, right ear: Secondary | ICD-10-CM | POA: Diagnosis not present

## 2022-07-20 DIAGNOSIS — Z01818 Encounter for other preprocedural examination: Secondary | ICD-10-CM | POA: Diagnosis not present

## 2022-07-20 DIAGNOSIS — H25811 Combined forms of age-related cataract, right eye: Secondary | ICD-10-CM | POA: Diagnosis not present

## 2022-08-05 DIAGNOSIS — H25811 Combined forms of age-related cataract, right eye: Secondary | ICD-10-CM | POA: Diagnosis not present

## 2022-08-26 DIAGNOSIS — H25812 Combined forms of age-related cataract, left eye: Secondary | ICD-10-CM | POA: Diagnosis not present

## 2022-10-13 DIAGNOSIS — H16223 Keratoconjunctivitis sicca, not specified as Sjogren's, bilateral: Secondary | ICD-10-CM | POA: Diagnosis not present

## 2022-11-10 DIAGNOSIS — M9903 Segmental and somatic dysfunction of lumbar region: Secondary | ICD-10-CM | POA: Diagnosis not present

## 2022-11-10 DIAGNOSIS — M5136 Other intervertebral disc degeneration, lumbar region: Secondary | ICD-10-CM | POA: Diagnosis not present

## 2022-11-10 DIAGNOSIS — M5033 Other cervical disc degeneration, cervicothoracic region: Secondary | ICD-10-CM | POA: Diagnosis not present

## 2022-11-10 DIAGNOSIS — M9901 Segmental and somatic dysfunction of cervical region: Secondary | ICD-10-CM | POA: Diagnosis not present

## 2022-11-11 DIAGNOSIS — M5033 Other cervical disc degeneration, cervicothoracic region: Secondary | ICD-10-CM | POA: Diagnosis not present

## 2022-11-11 DIAGNOSIS — M9901 Segmental and somatic dysfunction of cervical region: Secondary | ICD-10-CM | POA: Diagnosis not present

## 2022-11-11 DIAGNOSIS — M5136 Other intervertebral disc degeneration, lumbar region: Secondary | ICD-10-CM | POA: Diagnosis not present

## 2022-11-11 DIAGNOSIS — M9903 Segmental and somatic dysfunction of lumbar region: Secondary | ICD-10-CM | POA: Diagnosis not present

## 2022-11-15 DIAGNOSIS — M5136 Other intervertebral disc degeneration, lumbar region: Secondary | ICD-10-CM | POA: Diagnosis not present

## 2022-11-15 DIAGNOSIS — M5033 Other cervical disc degeneration, cervicothoracic region: Secondary | ICD-10-CM | POA: Diagnosis not present

## 2022-11-15 DIAGNOSIS — M9901 Segmental and somatic dysfunction of cervical region: Secondary | ICD-10-CM | POA: Diagnosis not present

## 2022-11-15 DIAGNOSIS — M9903 Segmental and somatic dysfunction of lumbar region: Secondary | ICD-10-CM | POA: Diagnosis not present

## 2022-11-16 DIAGNOSIS — M5033 Other cervical disc degeneration, cervicothoracic region: Secondary | ICD-10-CM | POA: Diagnosis not present

## 2022-11-16 DIAGNOSIS — M5136 Other intervertebral disc degeneration, lumbar region: Secondary | ICD-10-CM | POA: Diagnosis not present

## 2022-11-16 DIAGNOSIS — M9901 Segmental and somatic dysfunction of cervical region: Secondary | ICD-10-CM | POA: Diagnosis not present

## 2022-11-16 DIAGNOSIS — M9903 Segmental and somatic dysfunction of lumbar region: Secondary | ICD-10-CM | POA: Diagnosis not present

## 2022-11-17 DIAGNOSIS — M9903 Segmental and somatic dysfunction of lumbar region: Secondary | ICD-10-CM | POA: Diagnosis not present

## 2022-11-17 DIAGNOSIS — M9901 Segmental and somatic dysfunction of cervical region: Secondary | ICD-10-CM | POA: Diagnosis not present

## 2022-11-17 DIAGNOSIS — M5033 Other cervical disc degeneration, cervicothoracic region: Secondary | ICD-10-CM | POA: Diagnosis not present

## 2022-11-17 DIAGNOSIS — M5136 Other intervertebral disc degeneration, lumbar region: Secondary | ICD-10-CM | POA: Diagnosis not present

## 2022-11-25 DIAGNOSIS — M9903 Segmental and somatic dysfunction of lumbar region: Secondary | ICD-10-CM | POA: Diagnosis not present

## 2022-11-25 DIAGNOSIS — M5033 Other cervical disc degeneration, cervicothoracic region: Secondary | ICD-10-CM | POA: Diagnosis not present

## 2022-11-25 DIAGNOSIS — M9901 Segmental and somatic dysfunction of cervical region: Secondary | ICD-10-CM | POA: Diagnosis not present

## 2022-11-25 DIAGNOSIS — M5136 Other intervertebral disc degeneration, lumbar region: Secondary | ICD-10-CM | POA: Diagnosis not present

## 2022-11-29 DIAGNOSIS — M9903 Segmental and somatic dysfunction of lumbar region: Secondary | ICD-10-CM | POA: Diagnosis not present

## 2022-11-29 DIAGNOSIS — M9901 Segmental and somatic dysfunction of cervical region: Secondary | ICD-10-CM | POA: Diagnosis not present

## 2022-11-29 DIAGNOSIS — M5136 Other intervertebral disc degeneration, lumbar region: Secondary | ICD-10-CM | POA: Diagnosis not present

## 2022-11-29 DIAGNOSIS — M5033 Other cervical disc degeneration, cervicothoracic region: Secondary | ICD-10-CM | POA: Diagnosis not present

## 2022-11-30 DIAGNOSIS — M9901 Segmental and somatic dysfunction of cervical region: Secondary | ICD-10-CM | POA: Diagnosis not present

## 2022-11-30 DIAGNOSIS — M9903 Segmental and somatic dysfunction of lumbar region: Secondary | ICD-10-CM | POA: Diagnosis not present

## 2022-11-30 DIAGNOSIS — M5033 Other cervical disc degeneration, cervicothoracic region: Secondary | ICD-10-CM | POA: Diagnosis not present

## 2022-11-30 DIAGNOSIS — M5136 Other intervertebral disc degeneration, lumbar region: Secondary | ICD-10-CM | POA: Diagnosis not present

## 2022-12-06 DIAGNOSIS — M9903 Segmental and somatic dysfunction of lumbar region: Secondary | ICD-10-CM | POA: Diagnosis not present

## 2022-12-06 DIAGNOSIS — M5033 Other cervical disc degeneration, cervicothoracic region: Secondary | ICD-10-CM | POA: Diagnosis not present

## 2022-12-06 DIAGNOSIS — M5136 Other intervertebral disc degeneration, lumbar region: Secondary | ICD-10-CM | POA: Diagnosis not present

## 2022-12-06 DIAGNOSIS — M9901 Segmental and somatic dysfunction of cervical region: Secondary | ICD-10-CM | POA: Diagnosis not present

## 2022-12-07 DIAGNOSIS — M5136 Other intervertebral disc degeneration, lumbar region: Secondary | ICD-10-CM | POA: Diagnosis not present

## 2022-12-07 DIAGNOSIS — M9903 Segmental and somatic dysfunction of lumbar region: Secondary | ICD-10-CM | POA: Diagnosis not present

## 2022-12-07 DIAGNOSIS — M9901 Segmental and somatic dysfunction of cervical region: Secondary | ICD-10-CM | POA: Diagnosis not present

## 2022-12-07 DIAGNOSIS — M5033 Other cervical disc degeneration, cervicothoracic region: Secondary | ICD-10-CM | POA: Diagnosis not present

## 2022-12-08 DIAGNOSIS — M9901 Segmental and somatic dysfunction of cervical region: Secondary | ICD-10-CM | POA: Diagnosis not present

## 2022-12-08 DIAGNOSIS — M5033 Other cervical disc degeneration, cervicothoracic region: Secondary | ICD-10-CM | POA: Diagnosis not present

## 2022-12-08 DIAGNOSIS — M5136 Other intervertebral disc degeneration, lumbar region: Secondary | ICD-10-CM | POA: Diagnosis not present

## 2022-12-08 DIAGNOSIS — M9903 Segmental and somatic dysfunction of lumbar region: Secondary | ICD-10-CM | POA: Diagnosis not present

## 2022-12-13 DIAGNOSIS — M5136 Other intervertebral disc degeneration, lumbar region: Secondary | ICD-10-CM | POA: Diagnosis not present

## 2022-12-13 DIAGNOSIS — M9903 Segmental and somatic dysfunction of lumbar region: Secondary | ICD-10-CM | POA: Diagnosis not present

## 2022-12-13 DIAGNOSIS — M5033 Other cervical disc degeneration, cervicothoracic region: Secondary | ICD-10-CM | POA: Diagnosis not present

## 2022-12-13 DIAGNOSIS — M9901 Segmental and somatic dysfunction of cervical region: Secondary | ICD-10-CM | POA: Diagnosis not present

## 2022-12-14 DIAGNOSIS — M9903 Segmental and somatic dysfunction of lumbar region: Secondary | ICD-10-CM | POA: Diagnosis not present

## 2022-12-14 DIAGNOSIS — M9901 Segmental and somatic dysfunction of cervical region: Secondary | ICD-10-CM | POA: Diagnosis not present

## 2022-12-14 DIAGNOSIS — M5136 Other intervertebral disc degeneration, lumbar region: Secondary | ICD-10-CM | POA: Diagnosis not present

## 2022-12-14 DIAGNOSIS — M5033 Other cervical disc degeneration, cervicothoracic region: Secondary | ICD-10-CM | POA: Diagnosis not present

## 2023-01-03 DIAGNOSIS — M9903 Segmental and somatic dysfunction of lumbar region: Secondary | ICD-10-CM | POA: Diagnosis not present

## 2023-01-03 DIAGNOSIS — M5033 Other cervical disc degeneration, cervicothoracic region: Secondary | ICD-10-CM | POA: Diagnosis not present

## 2023-01-03 DIAGNOSIS — M9901 Segmental and somatic dysfunction of cervical region: Secondary | ICD-10-CM | POA: Diagnosis not present

## 2023-01-04 DIAGNOSIS — M9901 Segmental and somatic dysfunction of cervical region: Secondary | ICD-10-CM | POA: Diagnosis not present

## 2023-01-04 DIAGNOSIS — M9903 Segmental and somatic dysfunction of lumbar region: Secondary | ICD-10-CM | POA: Diagnosis not present

## 2023-01-04 DIAGNOSIS — M5033 Other cervical disc degeneration, cervicothoracic region: Secondary | ICD-10-CM | POA: Diagnosis not present

## 2023-01-10 DIAGNOSIS — M5033 Other cervical disc degeneration, cervicothoracic region: Secondary | ICD-10-CM | POA: Diagnosis not present

## 2023-01-10 DIAGNOSIS — M5136 Other intervertebral disc degeneration, lumbar region with discogenic back pain only: Secondary | ICD-10-CM | POA: Diagnosis not present

## 2023-01-10 DIAGNOSIS — M9901 Segmental and somatic dysfunction of cervical region: Secondary | ICD-10-CM | POA: Diagnosis not present

## 2023-01-10 DIAGNOSIS — M9903 Segmental and somatic dysfunction of lumbar region: Secondary | ICD-10-CM | POA: Diagnosis not present

## 2023-01-12 DIAGNOSIS — M5033 Other cervical disc degeneration, cervicothoracic region: Secondary | ICD-10-CM | POA: Diagnosis not present

## 2023-01-12 DIAGNOSIS — M9901 Segmental and somatic dysfunction of cervical region: Secondary | ICD-10-CM | POA: Diagnosis not present

## 2023-01-12 DIAGNOSIS — M9903 Segmental and somatic dysfunction of lumbar region: Secondary | ICD-10-CM | POA: Diagnosis not present

## 2023-01-19 DIAGNOSIS — M5033 Other cervical disc degeneration, cervicothoracic region: Secondary | ICD-10-CM | POA: Diagnosis not present

## 2023-01-19 DIAGNOSIS — M5136 Other intervertebral disc degeneration, lumbar region with discogenic back pain only: Secondary | ICD-10-CM | POA: Diagnosis not present

## 2023-01-19 DIAGNOSIS — M9903 Segmental and somatic dysfunction of lumbar region: Secondary | ICD-10-CM | POA: Diagnosis not present

## 2023-01-19 DIAGNOSIS — M9901 Segmental and somatic dysfunction of cervical region: Secondary | ICD-10-CM | POA: Diagnosis not present

## 2023-01-20 DIAGNOSIS — M5136 Other intervertebral disc degeneration, lumbar region with discogenic back pain only: Secondary | ICD-10-CM | POA: Diagnosis not present

## 2023-01-20 DIAGNOSIS — M9903 Segmental and somatic dysfunction of lumbar region: Secondary | ICD-10-CM | POA: Diagnosis not present

## 2023-01-20 DIAGNOSIS — M5033 Other cervical disc degeneration, cervicothoracic region: Secondary | ICD-10-CM | POA: Diagnosis not present

## 2023-01-20 DIAGNOSIS — M9901 Segmental and somatic dysfunction of cervical region: Secondary | ICD-10-CM | POA: Diagnosis not present

## 2023-07-07 IMAGING — DX DG KNEE 1-2V*L*
2 series · 2 of 2 positions shown · non-contrast
Comparison: None.

CLINICAL DATA: 55-year-old female with knee pain and swelling.

EXAM:
LEFT KNEE - 1-2 VIEW

[knee ap]
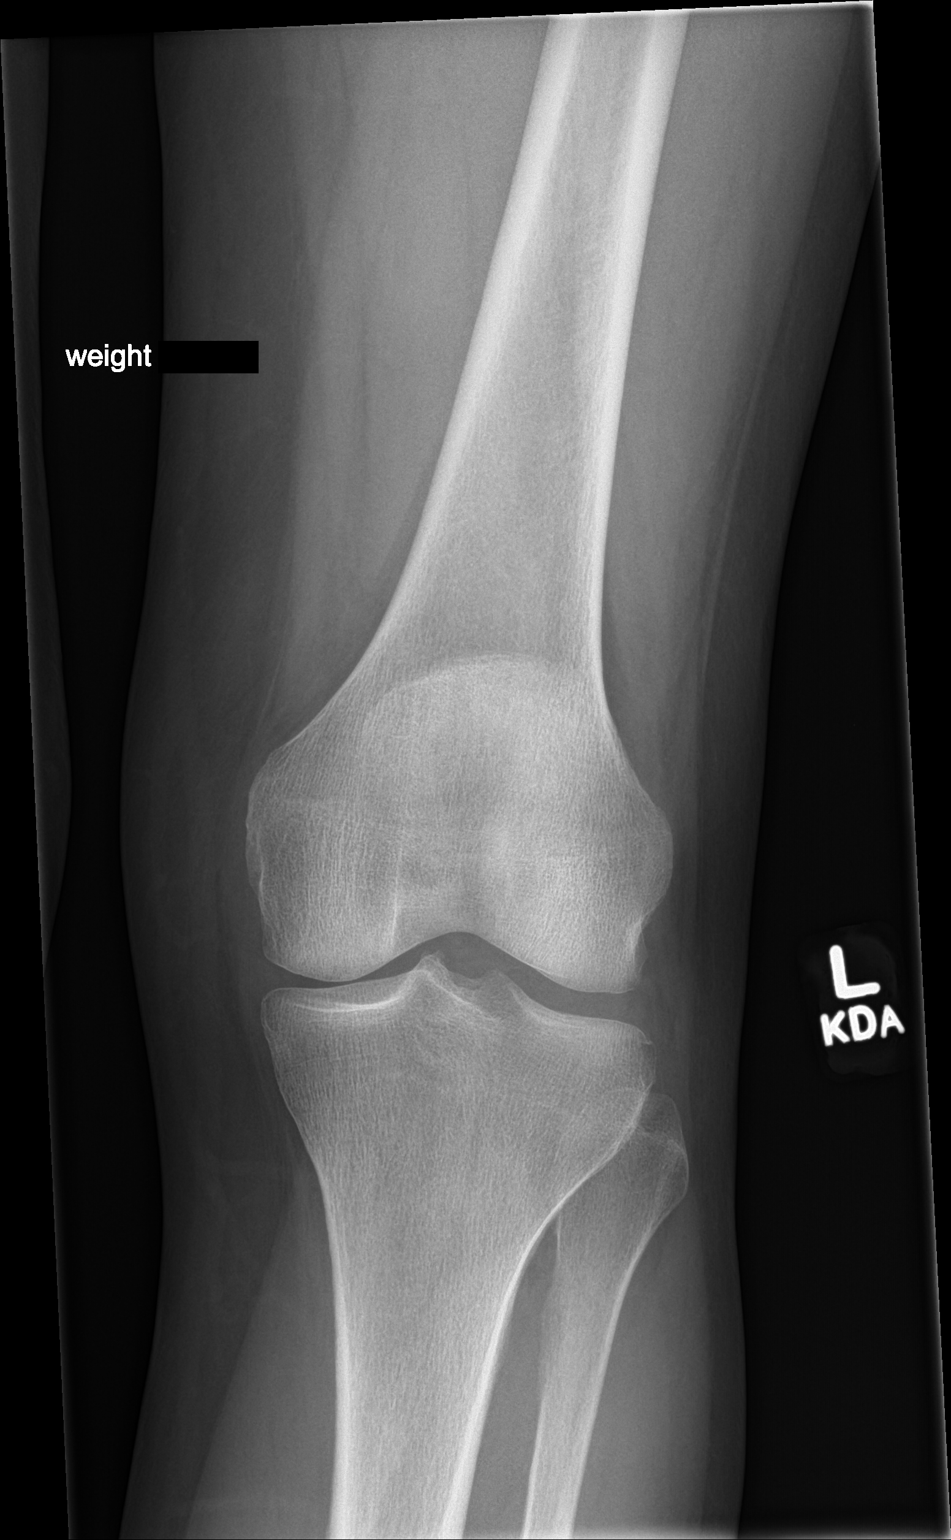

[knee lat]
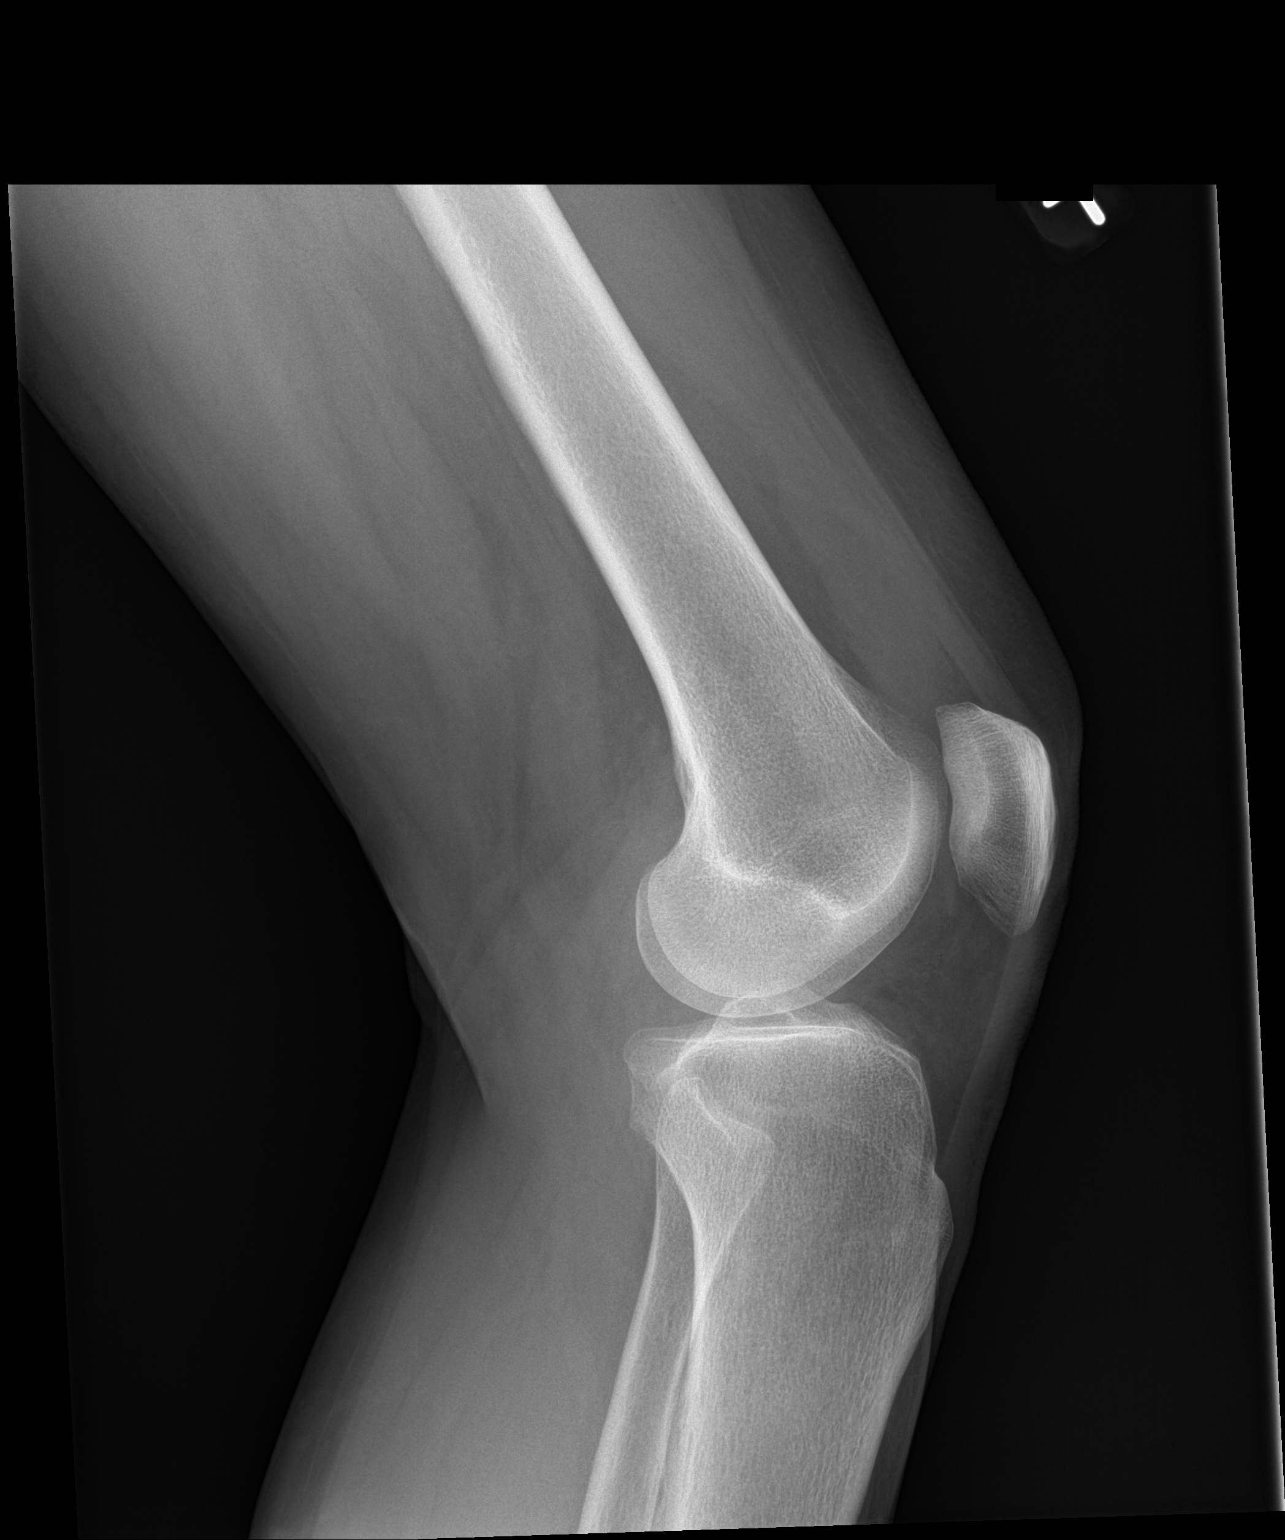

[2 of 2 positions shown; findings below may reference images not displayed]

FINDINGS: No acute fracture or malalignment. Mild medial compartment joint
space narrowing with mild associated subchondral sclerosis of the
medial tibial plateau. Small joint effusion. Soft tissues are
otherwise within normal limits.
IMPRESSION: 1. No acute fracture or malalignment.
2. Small knee joint effusion.
3. Mild tricompartmental degenerative changes.

## 2023-10-07 ENCOUNTER — Encounter: Payer: Self-pay | Admitting: Advanced Practice Midwife
